# Patient Record
Sex: Female | Born: 1945 | Race: White | Hispanic: No | Marital: Married | Smoking: Never smoker
Health system: Southern US, Community
[De-identification: ages and names within clinical notes are randomized; demographics above are authoritative.]

## PROBLEM LIST (undated history)

## (undated) DIAGNOSIS — K635 Polyp of colon: Secondary | ICD-10-CM

## (undated) DIAGNOSIS — H409 Unspecified glaucoma: Secondary | ICD-10-CM

## (undated) DIAGNOSIS — C44321 Squamous cell carcinoma of skin of nose: Secondary | ICD-10-CM

## (undated) DIAGNOSIS — C50919 Malignant neoplasm of unspecified site of unspecified female breast: Secondary | ICD-10-CM

## (undated) DIAGNOSIS — C541 Malignant neoplasm of endometrium: Secondary | ICD-10-CM

## (undated) DIAGNOSIS — C44311 Basal cell carcinoma of skin of nose: Secondary | ICD-10-CM

## (undated) DIAGNOSIS — N842 Polyp of vagina: Secondary | ICD-10-CM

## (undated) DIAGNOSIS — M8580 Other specified disorders of bone density and structure, unspecified site: Secondary | ICD-10-CM

## (undated) HISTORY — PX: ABDOMINAL HYSTERECTOMY: SHX81

## (undated) HISTORY — DX: Unspecified glaucoma: H40.9

## (undated) HISTORY — DX: Other specified disorders of bone density and structure, unspecified site: M85.80

## (undated) HISTORY — DX: Malignant neoplasm of unspecified site of unspecified female breast: C50.919

## (undated) HISTORY — DX: Malignant neoplasm of endometrium: C54.1

## (undated) HISTORY — PX: PORT-A-CATH REMOVAL: SHX5289

## (undated) HISTORY — DX: Polyp of vagina: N84.2

## (undated) HISTORY — DX: Polyp of colon: K63.5

## (undated) HISTORY — DX: Squamous cell carcinoma of skin of nose: C44.321

## (undated) HISTORY — DX: Basal cell carcinoma of skin of nose: C44.311

## (undated) HISTORY — PX: TONSILLECTOMY: SUR1361

---

## 2007-01-23 DIAGNOSIS — C50919 Malignant neoplasm of unspecified site of unspecified female breast: Secondary | ICD-10-CM

## 2007-01-23 HISTORY — DX: Malignant neoplasm of unspecified site of unspecified female breast: C50.919

## 2007-03-12 HISTORY — PX: BREAST LUMPECTOMY W/ NEEDLE LOCALIZATION: SHX1266

## 2008-02-16 ENCOUNTER — Ambulatory Visit: Payer: Self-pay | Admitting: Oncology

## 2008-02-21 ENCOUNTER — Encounter: Admission: RE | Admit: 2008-02-21 | Discharge: 2008-02-21 | Payer: Self-pay | Admitting: General Surgery

## 2008-02-23 LAB — CBC WITH DIFFERENTIAL/PLATELET
Basophils Absolute: 0 10*3/uL (ref 0.0–0.1)
EOS%: 1.9 % (ref 0.0–7.0)
HCT: 38.1 % (ref 34.8–46.6)
HGB: 13.1 g/dL (ref 11.6–15.9)
LYMPH%: 31.8 % (ref 14.0–48.0)
MCH: 31 pg (ref 26.0–34.0)
MCV: 90.4 fL (ref 81.0–101.0)
MONO%: 8.3 % (ref 0.0–13.0)
NEUT%: 57 % (ref 39.6–76.8)
Platelets: 277 10*3/uL (ref 145–400)

## 2008-02-23 LAB — COMPREHENSIVE METABOLIC PANEL
AST: 18 U/L (ref 0–37)
Alkaline Phosphatase: 49 U/L (ref 39–117)
BUN: 12 mg/dL (ref 6–23)
Creatinine, Ser: 0.8 mg/dL (ref 0.40–1.20)
Glucose, Bld: 96 mg/dL (ref 70–99)

## 2008-02-23 LAB — CANCER ANTIGEN 27.29: CA 27.29: 20 U/mL (ref 0–39)

## 2008-02-24 LAB — VITAMIN D 25 HYDROXY (VIT D DEFICIENCY, FRACTURES): Vit D, 25-Hydroxy: 42 ng/mL (ref 30–89)

## 2008-02-28 ENCOUNTER — Ambulatory Visit: Admission: RE | Admit: 2008-02-28 | Discharge: 2008-02-28 | Payer: Self-pay | Admitting: Oncology

## 2008-02-28 ENCOUNTER — Encounter: Payer: Self-pay | Admitting: Oncology

## 2008-02-29 LAB — VITAMIN D 1,25 DIHYDROXY: Vit D, 1,25-Dihydroxy: 61 pg/mL (ref 15–75)

## 2008-03-06 ENCOUNTER — Ambulatory Visit (HOSPITAL_BASED_OUTPATIENT_CLINIC_OR_DEPARTMENT_OTHER): Admission: RE | Admit: 2008-03-06 | Discharge: 2008-03-06 | Payer: Self-pay | Admitting: General Surgery

## 2008-03-06 ENCOUNTER — Encounter (INDEPENDENT_AMBULATORY_CARE_PROVIDER_SITE_OTHER): Payer: Self-pay | Admitting: General Surgery

## 2008-03-14 ENCOUNTER — Ambulatory Visit: Admission: RE | Admit: 2008-03-14 | Discharge: 2008-06-12 | Payer: Self-pay | Admitting: Radiation Oncology

## 2008-03-24 ENCOUNTER — Encounter (INDEPENDENT_AMBULATORY_CARE_PROVIDER_SITE_OTHER): Payer: Self-pay | Admitting: General Surgery

## 2008-03-24 ENCOUNTER — Ambulatory Visit (HOSPITAL_COMMUNITY): Admission: RE | Admit: 2008-03-24 | Discharge: 2008-03-25 | Payer: Self-pay | Admitting: General Surgery

## 2008-03-24 HISTORY — PX: MASTECTOMY: SHX3

## 2008-04-06 ENCOUNTER — Ambulatory Visit: Payer: Self-pay | Admitting: Oncology

## 2008-04-25 LAB — CBC WITH DIFFERENTIAL/PLATELET
BASO%: 0.8 % (ref 0.0–2.0)
Basophils Absolute: 0 10*3/uL (ref 0.0–0.1)
EOS%: 5.3 % (ref 0.0–7.0)
Eosinophils Absolute: 0.2 10*3/uL (ref 0.0–0.5)
HCT: 38.1 % (ref 34.8–46.6)
HGB: 13.2 g/dL (ref 11.6–15.9)
LYMPH%: 32.6 % (ref 14.0–48.0)
MCH: 31.2 pg (ref 26.0–34.0)
MCHC: 34.5 g/dL (ref 32.0–36.0)
MCV: 90.5 fL (ref 81.0–101.0)
MONO#: 0.2 10*3/uL (ref 0.1–0.9)
MONO%: 6.5 % (ref 0.0–13.0)
NEUT#: 1.8 10*3/uL (ref 1.5–6.5)
NEUT%: 54.9 % (ref 39.6–76.8)
Platelets: 135 10*3/uL — ABNORMAL LOW (ref 145–400)
RBC: 4.22 10*6/uL (ref 3.70–5.32)
RDW: 11.8 % (ref 11.3–14.5)
WBC: 3.2 10*3/uL — ABNORMAL LOW (ref 3.9–10.0)
lymph#: 1 10*3/uL (ref 0.9–3.3)

## 2008-05-02 LAB — CBC WITH DIFFERENTIAL/PLATELET
Basophils Absolute: 0.1 10*3/uL (ref 0.0–0.1)
Eosinophils Absolute: 0.1 10*3/uL (ref 0.0–0.5)
HGB: 12.4 g/dL (ref 11.6–15.9)
MONO#: 0.6 10*3/uL (ref 0.1–0.9)
NEUT#: 8 10*3/uL — ABNORMAL HIGH (ref 1.5–6.5)
RDW: 12.8 % (ref 11.3–14.5)
lymph#: 1.7 10*3/uL (ref 0.9–3.3)

## 2008-05-09 LAB — COMPREHENSIVE METABOLIC PANEL
AST: 20 U/L (ref 0–37)
Albumin: 4.5 g/dL (ref 3.5–5.2)
BUN: 14 mg/dL (ref 6–23)
Calcium: 10 mg/dL (ref 8.4–10.5)
Chloride: 104 mEq/L (ref 96–112)
Glucose, Bld: 134 mg/dL — ABNORMAL HIGH (ref 70–99)
Potassium: 4.1 mEq/L (ref 3.5–5.3)

## 2008-05-09 LAB — CBC WITH DIFFERENTIAL/PLATELET
BASO%: 0.1 % (ref 0.0–2.0)
Basophils Absolute: 0 10*3/uL (ref 0.0–0.1)
EOS%: 0.1 % (ref 0.0–7.0)
HGB: 12 g/dL (ref 11.6–15.9)
MCH: 31.2 pg (ref 26.0–34.0)
NEUT#: 9.7 10*3/uL — ABNORMAL HIGH (ref 1.5–6.5)
RDW: 13.8 % (ref 11.3–14.5)
lymph#: 0.6 10*3/uL — ABNORMAL LOW (ref 0.9–3.3)

## 2008-05-19 ENCOUNTER — Ambulatory Visit: Payer: Self-pay | Admitting: Oncology

## 2008-05-23 LAB — CBC WITH DIFFERENTIAL/PLATELET
Basophils Absolute: 0.1 10*3/uL (ref 0.0–0.1)
Eosinophils Absolute: 0 10*3/uL (ref 0.0–0.5)
HGB: 11.6 g/dL (ref 11.6–15.9)
MCV: 90.6 fL (ref 81.0–101.0)
MONO#: 0.5 10*3/uL (ref 0.1–0.9)
NEUT#: 7.2 10*3/uL — ABNORMAL HIGH (ref 1.5–6.5)
RDW: 14.5 % (ref 11.3–14.5)
lymph#: 1.4 10*3/uL (ref 0.9–3.3)

## 2008-05-30 LAB — COMPREHENSIVE METABOLIC PANEL
Alkaline Phosphatase: 60 U/L (ref 39–117)
BUN: 14 mg/dL (ref 6–23)
CO2: 24 mEq/L (ref 19–32)
Glucose, Bld: 126 mg/dL — ABNORMAL HIGH (ref 70–99)
Sodium: 141 mEq/L (ref 135–145)
Total Bilirubin: 0.4 mg/dL (ref 0.3–1.2)
Total Protein: 7 g/dL (ref 6.0–8.3)

## 2008-05-30 LAB — CBC WITH DIFFERENTIAL/PLATELET
Basophils Absolute: 0 10*3/uL (ref 0.0–0.1)
Eosinophils Absolute: 0 10*3/uL (ref 0.0–0.5)
HCT: 34.1 % — ABNORMAL LOW (ref 34.8–46.6)
HGB: 11.6 g/dL (ref 11.6–15.9)
LYMPH%: 5.6 % — ABNORMAL LOW (ref 14.0–48.0)
MCV: 92.6 fL (ref 81.0–101.0)
MONO#: 0.1 10*3/uL (ref 0.1–0.9)
MONO%: 1.4 % (ref 0.0–13.0)
NEUT#: 10.2 10*3/uL — ABNORMAL HIGH (ref 1.5–6.5)
NEUT%: 92.9 % — ABNORMAL HIGH (ref 39.6–76.8)
Platelets: 200 10*3/uL (ref 145–400)
WBC: 11 10*3/uL — ABNORMAL HIGH (ref 3.9–10.0)

## 2008-06-06 LAB — CBC WITH DIFFERENTIAL/PLATELET
Basophils Absolute: 0.1 10*3/uL (ref 0.0–0.1)
Eosinophils Absolute: 0.1 10*3/uL (ref 0.0–0.5)
HGB: 11.4 g/dL — ABNORMAL LOW (ref 11.6–15.9)
LYMPH%: 24.2 % (ref 14.0–48.0)
MONO#: 1 10*3/uL — ABNORMAL HIGH (ref 0.1–0.9)
NEUT#: 3.6 10*3/uL (ref 1.5–6.5)
Platelets: 126 10*3/uL — ABNORMAL LOW (ref 145–400)
RBC: 3.6 10*6/uL — ABNORMAL LOW (ref 3.70–5.32)
RDW: 14.7 % — ABNORMAL HIGH (ref 11.3–14.5)
WBC: 6.3 10*3/uL (ref 3.9–10.0)

## 2008-06-13 LAB — CBC WITH DIFFERENTIAL/PLATELET
Eosinophils Absolute: 0 10*3/uL (ref 0.0–0.5)
HCT: 33.9 % — ABNORMAL LOW (ref 34.8–46.6)
LYMPH%: 18.5 % (ref 14.0–48.0)
MCV: 93.9 fL (ref 81.0–101.0)
MONO#: 0.4 10*3/uL (ref 0.1–0.9)
MONO%: 5.5 % (ref 0.0–13.0)
NEUT#: 5 10*3/uL (ref 1.5–6.5)
NEUT%: 74.5 % (ref 39.6–76.8)
Platelets: 186 10*3/uL (ref 145–400)
WBC: 6.7 10*3/uL (ref 3.9–10.0)

## 2008-06-20 LAB — CBC WITH DIFFERENTIAL/PLATELET
BASO%: 0.3 % (ref 0.0–2.0)
EOS%: 0.1 % (ref 0.0–7.0)
HCT: 31.6 % — ABNORMAL LOW (ref 34.8–46.6)
LYMPH%: 7.9 % — ABNORMAL LOW (ref 14.0–48.0)
MCH: 32.1 pg (ref 26.0–34.0)
MCHC: 34.1 g/dL (ref 32.0–36.0)
MCV: 94.3 fL (ref 81.0–101.0)
MONO%: 2.5 % (ref 0.0–13.0)
NEUT%: 89.3 % — ABNORMAL HIGH (ref 39.6–76.8)
Platelets: 202 10*3/uL (ref 145–400)

## 2008-06-20 LAB — COMPREHENSIVE METABOLIC PANEL
AST: 17 U/L (ref 0–37)
Albumin: 4.2 g/dL (ref 3.5–5.2)
CO2: 23 mEq/L (ref 19–32)
Calcium: 9.8 mg/dL (ref 8.4–10.5)
Creatinine, Ser: 0.62 mg/dL (ref 0.40–1.20)
Total Protein: 6.4 g/dL (ref 6.0–8.3)

## 2008-06-27 LAB — CBC WITH DIFFERENTIAL/PLATELET
BASO%: 1.1 % (ref 0.0–2.0)
EOS%: 0.7 % (ref 0.0–7.0)
HCT: 33.6 % — ABNORMAL LOW (ref 34.8–46.6)
MCH: 32.2 pg (ref 26.0–34.0)
MCHC: 34.4 g/dL (ref 32.0–36.0)
MCV: 93.6 fL (ref 81.0–101.0)
MONO%: 17.2 % — ABNORMAL HIGH (ref 0.0–13.0)
NEUT%: 58.4 % (ref 39.6–76.8)
RDW: 14.8 % — ABNORMAL HIGH (ref 11.3–14.5)
lymph#: 1.4 10*3/uL (ref 0.9–3.3)

## 2008-07-04 ENCOUNTER — Ambulatory Visit: Payer: Self-pay | Admitting: Oncology

## 2008-07-11 LAB — CBC WITH DIFFERENTIAL/PLATELET
BASO%: 0.2 % (ref 0.0–2.0)
Basophils Absolute: 0 10*3/uL (ref 0.0–0.1)
EOS%: 0 % (ref 0.0–7.0)
HGB: 11.2 g/dL — ABNORMAL LOW (ref 11.6–15.9)
MCH: 32.7 pg (ref 26.0–34.0)
MONO%: 2.5 % (ref 0.0–13.0)
RBC: 3.42 10*6/uL — ABNORMAL LOW (ref 3.70–5.32)
RDW: 16 % — ABNORMAL HIGH (ref 11.3–14.5)
lymph#: 0.5 10*3/uL — ABNORMAL LOW (ref 0.9–3.3)

## 2008-07-11 LAB — COMPREHENSIVE METABOLIC PANEL
BUN: 16 mg/dL (ref 6–23)
CO2: 25 mEq/L (ref 19–32)
Chloride: 106 mEq/L (ref 96–112)
Creatinine, Ser: 0.66 mg/dL (ref 0.40–1.20)
Glucose, Bld: 120 mg/dL — ABNORMAL HIGH (ref 70–99)
Potassium: 4 mEq/L (ref 3.5–5.3)
Sodium: 140 mEq/L (ref 135–145)
Total Bilirubin: 0.5 mg/dL (ref 0.3–1.2)

## 2008-07-18 LAB — CBC WITH DIFFERENTIAL/PLATELET
Basophils Absolute: 0.1 10*3/uL (ref 0.0–0.1)
Eosinophils Absolute: 0.1 10*3/uL (ref 0.0–0.5)
HCT: 31.5 % — ABNORMAL LOW (ref 34.8–46.6)
HGB: 10.9 g/dL — ABNORMAL LOW (ref 11.6–15.9)
LYMPH%: 24.6 % (ref 14.0–48.0)
MONO#: 1.1 10*3/uL — ABNORMAL HIGH (ref 0.1–0.9)
NEUT#: 3.3 10*3/uL (ref 1.5–6.5)
NEUT%: 54.3 % (ref 39.6–76.8)
Platelets: 139 10*3/uL — ABNORMAL LOW (ref 145–400)
RBC: 3.29 10*6/uL — ABNORMAL LOW (ref 3.70–5.32)
WBC: 6.2 10*3/uL (ref 3.9–10.0)

## 2008-07-19 ENCOUNTER — Ambulatory Visit: Admission: RE | Admit: 2008-07-19 | Discharge: 2008-07-19 | Payer: Self-pay | Admitting: Oncology

## 2008-07-19 ENCOUNTER — Encounter: Payer: Self-pay | Admitting: Oncology

## 2008-07-19 ENCOUNTER — Ambulatory Visit: Payer: Self-pay | Admitting: Cardiology

## 2008-07-25 LAB — CBC WITH DIFFERENTIAL/PLATELET
BASO%: 0.9 % (ref 0.0–2.0)
EOS%: 0.6 % (ref 0.0–7.0)
MCH: 33 pg (ref 26.0–34.0)
MCHC: 33.5 g/dL (ref 32.0–36.0)
MONO#: 0.4 10*3/uL (ref 0.1–0.9)
RBC: 3.32 10*6/uL — ABNORMAL LOW (ref 3.70–5.32)
RDW: 15.9 % — ABNORMAL HIGH (ref 11.3–14.5)
WBC: 6.6 10*3/uL (ref 3.9–10.0)
lymph#: 1 10*3/uL (ref 0.9–3.3)

## 2008-08-01 LAB — CBC WITH DIFFERENTIAL/PLATELET
Basophils Absolute: 0 10*3/uL (ref 0.0–0.1)
EOS%: 0.1 % (ref 0.0–7.0)
Eosinophils Absolute: 0 10*3/uL (ref 0.0–0.5)
HGB: 10.8 g/dL — ABNORMAL LOW (ref 11.6–15.9)
MCH: 33.4 pg (ref 26.0–34.0)
MONO#: 0.3 10*3/uL (ref 0.1–0.9)
NEUT#: 6.9 10*3/uL — ABNORMAL HIGH (ref 1.5–6.5)
RDW: 14.8 % — ABNORMAL HIGH (ref 11.3–14.5)
WBC: 7.7 10*3/uL (ref 3.9–10.0)
lymph#: 0.4 10*3/uL — ABNORMAL LOW (ref 0.9–3.3)

## 2008-08-01 LAB — COMPREHENSIVE METABOLIC PANEL
ALT: 40 U/L — ABNORMAL HIGH (ref 0–35)
AST: 19 U/L (ref 0–37)
Albumin: 4.4 g/dL (ref 3.5–5.2)
BUN: 15 mg/dL (ref 6–23)
CO2: 23 mEq/L (ref 19–32)
Calcium: 9.8 mg/dL (ref 8.4–10.5)
Chloride: 104 mEq/L (ref 96–112)
Potassium: 4 mEq/L (ref 3.5–5.3)

## 2008-08-08 LAB — CBC WITH DIFFERENTIAL/PLATELET
BASO%: 2.2 % — ABNORMAL HIGH (ref 0.0–2.0)
EOS%: 0.9 % (ref 0.0–7.0)
HGB: 10.6 g/dL — ABNORMAL LOW (ref 11.6–15.9)
MCH: 33.5 pg (ref 26.0–34.0)
MCHC: 34.6 g/dL (ref 32.0–36.0)
RDW: 13.4 % (ref 11.3–14.5)
lymph#: 1.5 10*3/uL (ref 0.9–3.3)

## 2008-08-10 ENCOUNTER — Encounter: Admission: RE | Admit: 2008-08-10 | Discharge: 2008-08-10 | Payer: Self-pay | Admitting: Oncology

## 2008-08-22 ENCOUNTER — Ambulatory Visit: Payer: Self-pay | Admitting: Oncology

## 2008-08-22 LAB — COMPREHENSIVE METABOLIC PANEL
ALT: 38 U/L — ABNORMAL HIGH (ref 0–35)
AST: 29 U/L (ref 0–37)
Albumin: 4.1 g/dL (ref 3.5–5.2)
Calcium: 9.4 mg/dL (ref 8.4–10.5)
Chloride: 106 mEq/L (ref 96–112)
Potassium: 4.2 mEq/L (ref 3.5–5.3)

## 2008-08-22 LAB — CBC WITH DIFFERENTIAL/PLATELET
Basophils Absolute: 0.1 10*3/uL (ref 0.0–0.1)
Eosinophils Absolute: 0.3 10*3/uL (ref 0.0–0.5)
HGB: 10.9 g/dL — ABNORMAL LOW (ref 11.6–15.9)
NEUT#: 1.4 10*3/uL — ABNORMAL LOW (ref 1.5–6.5)
RDW: 14 % (ref 11.3–14.5)
lymph#: 0.8 10*3/uL — ABNORMAL LOW (ref 0.9–3.3)

## 2008-09-12 LAB — CBC WITH DIFFERENTIAL/PLATELET
BASO%: 1.4 % (ref 0.0–2.0)
EOS%: 27.5 % — ABNORMAL HIGH (ref 0.0–7.0)
HCT: 35.4 % (ref 34.8–46.6)
LYMPH%: 29 % (ref 14.0–48.0)
MCH: 33.2 pg (ref 26.0–34.0)
MCHC: 34.1 g/dL (ref 32.0–36.0)
NEUT%: 32.8 % — ABNORMAL LOW (ref 39.6–76.8)
Platelets: 195 10*3/uL (ref 145–400)
RBC: 3.63 10*6/uL — ABNORMAL LOW (ref 3.70–5.32)

## 2008-10-03 LAB — CBC WITH DIFFERENTIAL/PLATELET
BASO%: 1.9 % (ref 0.0–2.0)
EOS%: 20.2 % — ABNORMAL HIGH (ref 0.0–7.0)
Eosinophils Absolute: 0.7 10*3/uL — ABNORMAL HIGH (ref 0.0–0.5)
LYMPH%: 34.1 % (ref 14.0–48.0)
MCH: 32.6 pg (ref 26.0–34.0)
MCHC: 33.5 g/dL (ref 32.0–36.0)
MCV: 97.3 fL (ref 81.0–101.0)
MONO%: 9.9 % (ref 0.0–13.0)
Platelets: 179 10*3/uL (ref 145–400)
RBC: 3.73 10*6/uL (ref 3.70–5.32)
RDW: 11.6 % (ref 11.3–14.5)

## 2008-10-03 LAB — CANCER ANTIGEN 27.29: CA 27.29: 14 U/mL (ref 0–39)

## 2008-10-04 LAB — COMPREHENSIVE METABOLIC PANEL

## 2008-10-20 ENCOUNTER — Ambulatory Visit: Payer: Self-pay | Admitting: Oncology

## 2008-10-24 LAB — COMPREHENSIVE METABOLIC PANEL
AST: 20 U/L (ref 0–37)
Alkaline Phosphatase: 56 U/L (ref 39–117)
BUN: 14 mg/dL (ref 6–23)
Calcium: 9.5 mg/dL (ref 8.4–10.5)
Creatinine, Ser: 0.71 mg/dL (ref 0.40–1.20)
Glucose, Bld: 81 mg/dL (ref 70–99)
Total Bilirubin: 0.4 mg/dL (ref 0.3–1.2)
Total Protein: 6.5 g/dL (ref 6.0–8.3)

## 2008-10-24 LAB — CBC WITH DIFFERENTIAL/PLATELET
Basophils Absolute: 0 10*3/uL (ref 0.0–0.1)
EOS%: 11.4 % — ABNORMAL HIGH (ref 0.0–7.0)
Eosinophils Absolute: 0.3 10*3/uL (ref 0.0–0.5)
HGB: 12.8 g/dL (ref 11.6–15.9)
LYMPH%: 32.6 % (ref 14.0–48.0)
MCH: 32.1 pg (ref 26.0–34.0)
MCV: 93.2 fL (ref 81.0–101.0)
MONO%: 10.5 % (ref 0.0–13.0)
NEUT#: 1.2 10*3/uL — ABNORMAL LOW (ref 1.5–6.5)
NEUT%: 44.3 % (ref 39.6–76.8)
Platelets: 172 10*3/uL (ref 145–400)

## 2008-10-31 ENCOUNTER — Ambulatory Visit: Admission: RE | Admit: 2008-10-31 | Discharge: 2008-10-31 | Payer: Self-pay | Admitting: Oncology

## 2008-10-31 ENCOUNTER — Encounter: Payer: Self-pay | Admitting: Oncology

## 2008-11-14 LAB — COMPREHENSIVE METABOLIC PANEL
ALT: 41 U/L — ABNORMAL HIGH (ref 0–35)
Alkaline Phosphatase: 53 U/L (ref 39–117)
Creatinine, Ser: 0.7 mg/dL (ref 0.40–1.20)
Glucose, Bld: 95 mg/dL (ref 70–99)
Sodium: 142 mEq/L (ref 135–145)
Total Bilirubin: 0.5 mg/dL (ref 0.3–1.2)
Total Protein: 6.5 g/dL (ref 6.0–8.3)

## 2008-11-14 LAB — CBC WITH DIFFERENTIAL/PLATELET
Basophils Absolute: 0.1 10*3/uL (ref 0.0–0.1)
EOS%: 13.2 % — ABNORMAL HIGH (ref 0.0–7.0)
Eosinophils Absolute: 0.4 10*3/uL (ref 0.0–0.5)
HGB: 12.6 g/dL (ref 11.6–15.9)
LYMPH%: 28.7 % (ref 14.0–48.0)
MCH: 31.6 pg (ref 26.0–34.0)
MCV: 91.1 fL (ref 81.0–101.0)
MONO%: 9.6 % (ref 0.0–13.0)
NEUT#: 1.6 10*3/uL (ref 1.5–6.5)
NEUT%: 46.7 % (ref 39.6–76.8)
Platelets: 200 10*3/uL (ref 145–400)

## 2008-11-14 LAB — CANCER ANTIGEN 27.29: CA 27.29: 15 U/mL (ref 0–39)

## 2008-12-05 ENCOUNTER — Ambulatory Visit: Payer: Self-pay | Admitting: Oncology

## 2008-12-05 LAB — COMPREHENSIVE METABOLIC PANEL
AST: 18 U/L (ref 0–37)
Albumin: 4.3 g/dL (ref 3.5–5.2)
Alkaline Phosphatase: 60 U/L (ref 39–117)
BUN: 14 mg/dL (ref 6–23)
Glucose, Bld: 72 mg/dL (ref 70–99)
Potassium: 4.3 mEq/L (ref 3.5–5.3)
Sodium: 142 mEq/L (ref 135–145)
Total Bilirubin: 0.5 mg/dL (ref 0.3–1.2)

## 2008-12-05 LAB — CBC WITH DIFFERENTIAL/PLATELET
Basophils Absolute: 0 10*3/uL (ref 0.0–0.1)
EOS%: 11.1 % — ABNORMAL HIGH (ref 0.0–7.0)
Eosinophils Absolute: 0.4 10*3/uL (ref 0.0–0.5)
HGB: 13.4 g/dL (ref 11.6–15.9)
MCV: 92.3 fL (ref 81.0–101.0)
MONO%: 7.8 % (ref 0.0–13.0)
NEUT#: 1.7 10*3/uL (ref 1.5–6.5)
RBC: 4.21 10*6/uL (ref 3.70–5.32)
RDW: 12.8 % (ref 11.3–14.5)
lymph#: 1.5 10*3/uL (ref 0.9–3.3)

## 2008-12-26 LAB — CBC WITH DIFFERENTIAL/PLATELET
BASO%: 0.8 % (ref 0.0–2.0)
EOS%: 5.1 % (ref 0.0–7.0)
HCT: 37 % (ref 34.8–46.6)
HGB: 12.9 g/dL (ref 11.6–15.9)
MCHC: 34.9 g/dL (ref 32.0–36.0)
MONO#: 0.4 10*3/uL (ref 0.1–0.9)
NEUT%: 47 % (ref 39.6–76.8)
RDW: 13.9 % (ref 11.3–14.5)
WBC: 3.7 10*3/uL — ABNORMAL LOW (ref 3.9–10.0)
lymph#: 1.4 10*3/uL (ref 0.9–3.3)

## 2008-12-26 LAB — COMPREHENSIVE METABOLIC PANEL
ALT: 17 U/L (ref 0–35)
Albumin: 4.3 g/dL (ref 3.5–5.2)
BUN: 15 mg/dL (ref 6–23)
CO2: 24 mEq/L (ref 19–32)
Calcium: 9.5 mg/dL (ref 8.4–10.5)
Chloride: 104 mEq/L (ref 96–112)
Creatinine, Ser: 0.77 mg/dL (ref 0.40–1.20)
Potassium: 3.8 mEq/L (ref 3.5–5.3)

## 2009-01-02 ENCOUNTER — Ambulatory Visit: Payer: Self-pay | Admitting: Internal Medicine

## 2009-01-10 ENCOUNTER — Ambulatory Visit: Payer: Self-pay

## 2009-01-15 ENCOUNTER — Ambulatory Visit: Payer: Self-pay | Admitting: Internal Medicine

## 2009-01-16 LAB — CBC WITH DIFFERENTIAL/PLATELET
Basophils Absolute: 0 10*3/uL (ref 0.0–0.1)
Eosinophils Absolute: 0.2 10*3/uL (ref 0.0–0.5)
HCT: 37 % (ref 34.8–46.6)
HGB: 12.5 g/dL (ref 11.6–15.9)
MCH: 30.7 pg (ref 25.1–34.0)
MONO#: 0.3 10*3/uL (ref 0.1–0.9)
NEUT#: 1.4 10*3/uL — ABNORMAL LOW (ref 1.5–6.5)
NEUT%: 46.8 % (ref 38.4–76.8)
RDW: 13.5 % (ref 11.2–14.5)
WBC: 3 10*3/uL — ABNORMAL LOW (ref 3.9–10.3)
lymph#: 1.1 10*3/uL (ref 0.9–3.3)

## 2009-01-16 LAB — COMPREHENSIVE METABOLIC PANEL
Albumin: 4.4 g/dL (ref 3.5–5.2)
Alkaline Phosphatase: 65 U/L (ref 39–117)
BUN: 16 mg/dL (ref 6–23)
CO2: 27 mEq/L (ref 19–32)
Calcium: 9.6 mg/dL (ref 8.4–10.5)
Chloride: 105 mEq/L (ref 96–112)
Glucose, Bld: 71 mg/dL (ref 70–99)
Potassium: 4.1 mEq/L (ref 3.5–5.3)
Sodium: 141 mEq/L (ref 135–145)
Total Protein: 6.8 g/dL (ref 6.0–8.3)

## 2009-01-30 ENCOUNTER — Ambulatory Visit: Admission: RE | Admit: 2009-01-30 | Discharge: 2009-01-30 | Payer: Self-pay | Admitting: Oncology

## 2009-01-30 ENCOUNTER — Encounter: Payer: Self-pay | Admitting: Oncology

## 2009-02-02 ENCOUNTER — Ambulatory Visit: Payer: Self-pay | Admitting: Oncology

## 2009-02-06 LAB — CBC WITH DIFFERENTIAL/PLATELET
BASO%: 0.5 % (ref 0.0–2.0)
Eosinophils Absolute: 0.8 10*3/uL — ABNORMAL HIGH (ref 0.0–0.5)
HCT: 39.4 % (ref 34.8–46.6)
LYMPH%: 34.4 % (ref 14.0–49.7)
MONO#: 0.3 10*3/uL (ref 0.1–0.9)
NEUT#: 1.7 10*3/uL (ref 1.5–6.5)
Platelets: 158 10*3/uL (ref 145–400)
RBC: 4.36 10*6/uL (ref 3.70–5.45)
WBC: 4.2 10*3/uL (ref 3.9–10.3)
lymph#: 1.5 10*3/uL (ref 0.9–3.3)
nRBC: 0 % (ref 0–0)

## 2009-05-07 ENCOUNTER — Ambulatory Visit: Payer: Self-pay | Admitting: Oncology

## 2009-05-09 ENCOUNTER — Encounter: Payer: Self-pay | Admitting: Internal Medicine

## 2009-05-09 LAB — CBC WITH DIFFERENTIAL/PLATELET
BASO%: 0.4 % (ref 0.0–2.0)
EOS%: 16 % — ABNORMAL HIGH (ref 0.0–7.0)
HCT: 37.4 % (ref 34.8–46.6)
LYMPH%: 28.5 % (ref 14.0–49.7)
MCH: 31.6 pg (ref 25.1–34.0)
MCHC: 34.2 g/dL (ref 31.5–36.0)
MCV: 92.3 fL (ref 79.5–101.0)
MONO%: 6.7 % (ref 0.0–14.0)
NEUT%: 48.4 % (ref 38.4–76.8)
Platelets: 161 10*3/uL (ref 145–400)

## 2009-05-10 LAB — COMPREHENSIVE METABOLIC PANEL
ALT: 14 U/L (ref 0–35)
AST: 16 U/L (ref 0–37)
BUN: 16 mg/dL (ref 6–23)
Creatinine, Ser: 0.81 mg/dL (ref 0.40–1.20)
Total Bilirubin: 0.4 mg/dL (ref 0.3–1.2)

## 2009-05-10 LAB — VITAMIN D 25 HYDROXY (VIT D DEFICIENCY, FRACTURES): Vit D, 25-Hydroxy: 44 ng/mL (ref 30–89)

## 2009-08-07 ENCOUNTER — Ambulatory Visit: Payer: Self-pay | Admitting: Oncology

## 2009-08-09 ENCOUNTER — Encounter: Payer: Self-pay | Admitting: Internal Medicine

## 2009-08-09 LAB — CBC WITH DIFFERENTIAL/PLATELET
BASO%: 1 % (ref 0.0–2.0)
EOS%: 7.9 % — ABNORMAL HIGH (ref 0.0–7.0)
Eosinophils Absolute: 0.3 10*3/uL (ref 0.0–0.5)
MCH: 32.4 pg (ref 25.1–34.0)
MCHC: 34.5 g/dL (ref 31.5–36.0)
MCV: 93.9 fL (ref 79.5–101.0)
MONO%: 11.2 % (ref 0.0–14.0)
NEUT#: 1.4 10*3/uL — ABNORMAL LOW (ref 1.5–6.5)
RBC: 4.05 10*6/uL (ref 3.70–5.45)
RDW: 13.3 % (ref 11.2–14.5)

## 2009-08-10 LAB — COMPREHENSIVE METABOLIC PANEL
ALT: 16 U/L (ref 0–35)
AST: 19 U/L (ref 0–37)
Albumin: 4.5 g/dL (ref 3.5–5.2)
Alkaline Phosphatase: 58 U/L (ref 39–117)
Potassium: 4.3 mEq/L (ref 3.5–5.3)
Sodium: 141 mEq/L (ref 135–145)
Total Protein: 6.8 g/dL (ref 6.0–8.3)

## 2009-12-07 ENCOUNTER — Ambulatory Visit: Payer: Self-pay | Admitting: Oncology

## 2009-12-11 ENCOUNTER — Encounter: Payer: Self-pay | Admitting: Internal Medicine

## 2009-12-11 LAB — CBC WITH DIFFERENTIAL/PLATELET
BASO%: 0.7 % (ref 0.0–2.0)
Basophils Absolute: 0 10*3/uL (ref 0.0–0.1)
EOS%: 3.5 % (ref 0.0–7.0)
Eosinophils Absolute: 0.2 10*3/uL (ref 0.0–0.5)
HCT: 38.6 % (ref 34.8–46.6)
HGB: 13.1 g/dL (ref 11.6–15.9)
LYMPH%: 26.3 % (ref 14.0–49.7)
MCH: 32.1 pg (ref 25.1–34.0)
MCHC: 34 g/dL (ref 31.5–36.0)
MCV: 94.7 fL (ref 79.5–101.0)
MONO#: 0.4 10*3/uL (ref 0.1–0.9)
MONO%: 8.1 % (ref 0.0–14.0)
NEUT#: 3.3 10*3/uL (ref 1.5–6.5)
NEUT%: 61.4 % (ref 38.4–76.8)
Platelets: 223 10*3/uL (ref 145–400)
RBC: 4.08 10*6/uL (ref 3.70–5.45)
RDW: 13.6 % (ref 11.2–14.5)
WBC: 5.3 10*3/uL (ref 3.9–10.3)
lymph#: 1.4 10*3/uL (ref 0.9–3.3)

## 2009-12-11 LAB — COMPREHENSIVE METABOLIC PANEL WITH GFR
ALT: 14 U/L (ref 0–35)
AST: 17 U/L (ref 0–37)
Albumin: 4.6 g/dL (ref 3.5–5.2)
Alkaline Phosphatase: 58 U/L (ref 39–117)
BUN: 16 mg/dL (ref 6–23)
CO2: 26 meq/L (ref 19–32)
Calcium: 10.2 mg/dL (ref 8.4–10.5)
Chloride: 103 meq/L (ref 96–112)
Creatinine, Ser: 0.77 mg/dL (ref 0.40–1.20)
Glucose, Bld: 104 mg/dL — ABNORMAL HIGH (ref 70–99)
Potassium: 4.1 meq/L (ref 3.5–5.3)
Sodium: 140 meq/L (ref 135–145)
Total Bilirubin: 0.4 mg/dL (ref 0.3–1.2)
Total Protein: 6.8 g/dL (ref 6.0–8.3)

## 2009-12-11 LAB — VITAMIN D 25 HYDROXY (VIT D DEFICIENCY, FRACTURES): Vit D, 25-Hydroxy: 51 ng/mL (ref 30–89)

## 2009-12-11 LAB — CANCER ANTIGEN 27.29: CA 27.29: 13 U/mL (ref 0–39)

## 2009-12-11 LAB — LACTATE DEHYDROGENASE: LDH: 126 U/L (ref 94–250)

## 2010-04-08 ENCOUNTER — Ambulatory Visit: Payer: Self-pay | Admitting: Oncology

## 2010-04-10 ENCOUNTER — Encounter: Payer: Self-pay | Admitting: Internal Medicine

## 2010-04-10 LAB — CBC WITH DIFFERENTIAL/PLATELET
BASO%: 0.7 % (ref 0.0–2.0)
Basophils Absolute: 0 10*3/uL (ref 0.0–0.1)
HCT: 36.5 % (ref 34.8–46.6)
HGB: 12.5 g/dL (ref 11.6–15.9)
MONO#: 0.4 10*3/uL (ref 0.1–0.9)
NEUT%: 47.9 % (ref 38.4–76.8)
WBC: 4.5 10*3/uL (ref 3.9–10.3)
lymph#: 1.4 10*3/uL (ref 0.9–3.3)

## 2010-04-10 LAB — COMPREHENSIVE METABOLIC PANEL
ALT: 11 U/L (ref 0–35)
Albumin: 4.1 g/dL (ref 3.5–5.2)
BUN: 12 mg/dL (ref 6–23)
CO2: 27 mEq/L (ref 19–32)
Calcium: 9.5 mg/dL (ref 8.4–10.5)
Chloride: 104 mEq/L (ref 96–112)
Creatinine, Ser: 0.81 mg/dL (ref 0.40–1.20)

## 2010-04-10 LAB — LACTATE DEHYDROGENASE: LDH: 112 U/L (ref 94–250)

## 2010-08-12 ENCOUNTER — Ambulatory Visit: Payer: Self-pay | Admitting: Oncology

## 2010-08-14 ENCOUNTER — Encounter: Payer: Self-pay | Admitting: Internal Medicine

## 2010-08-14 LAB — COMPREHENSIVE METABOLIC PANEL
AST: 15 U/L (ref 0–37)
Alkaline Phosphatase: 59 U/L (ref 39–117)
BUN: 16 mg/dL (ref 6–23)
Creatinine, Ser: 0.84 mg/dL (ref 0.40–1.20)

## 2010-08-14 LAB — CBC WITH DIFFERENTIAL/PLATELET
Basophils Absolute: 0 10*3/uL (ref 0.0–0.1)
EOS%: 3.5 % (ref 0.0–7.0)
HGB: 12.7 g/dL (ref 11.6–15.9)
MCH: 31.6 pg (ref 25.1–34.0)
MCV: 92.9 fL (ref 79.5–101.0)
MONO%: 9.7 % (ref 0.0–14.0)
NEUT%: 50.8 % (ref 38.4–76.8)
RDW: 13.8 % (ref 11.2–14.5)

## 2010-08-14 LAB — VITAMIN D 25 HYDROXY (VIT D DEFICIENCY, FRACTURES): Vit D, 25-Hydroxy: 60 ng/mL (ref 30–89)

## 2010-09-03 HISTORY — PX: BASAL CELL CARCINOMA EXCISION: SHX1214

## 2010-12-15 ENCOUNTER — Encounter: Payer: Self-pay | Admitting: General Surgery

## 2010-12-25 NOTE — Letter (Signed)
[facility]   Imported By: Roderic Ovens 05/29/2010 10:34:31  _____________________________________________________________________  External Attachment:    Type:   Image     Comment:   External Document

## 2010-12-25 NOTE — Letter (Signed)
[facility]   Imported By: Roderic Ovens 12/26/2009 11:22:51  _____________________________________________________________________  External Attachment:    Type:   Image     Comment:   External Document

## 2010-12-25 NOTE — Letter (Signed)
[facility]   Imported By: Roderic Ovens 09/04/2010 11:31:32  _____________________________________________________________________  External Attachment:    Type:   Image     Comment:   External Document

## 2011-02-12 ENCOUNTER — Other Ambulatory Visit: Payer: Self-pay | Admitting: Physician Assistant

## 2011-02-12 ENCOUNTER — Encounter (HOSPITAL_BASED_OUTPATIENT_CLINIC_OR_DEPARTMENT_OTHER): Payer: Medicare Other | Admitting: Oncology

## 2011-02-12 ENCOUNTER — Other Ambulatory Visit: Payer: Self-pay | Admitting: Oncology

## 2011-02-12 DIAGNOSIS — M810 Age-related osteoporosis without current pathological fracture: Secondary | ICD-10-CM

## 2011-02-12 DIAGNOSIS — C50519 Malignant neoplasm of lower-outer quadrant of unspecified female breast: Secondary | ICD-10-CM

## 2011-02-12 DIAGNOSIS — Z170 Estrogen receptor positive status [ER+]: Secondary | ICD-10-CM

## 2011-02-12 DIAGNOSIS — Z79899 Other long term (current) drug therapy: Secondary | ICD-10-CM

## 2011-02-12 DIAGNOSIS — C50919 Malignant neoplasm of unspecified site of unspecified female breast: Secondary | ICD-10-CM

## 2011-02-12 LAB — CBC WITH DIFFERENTIAL/PLATELET
BASO%: 0.7 % (ref 0.0–2.0)
EOS%: 4.1 % (ref 0.0–7.0)
Eosinophils Absolute: 0.2 10*3/uL (ref 0.0–0.5)
MCHC: 33.7 g/dL (ref 31.5–36.0)
MCV: 93.2 fL (ref 79.5–101.0)
MONO%: 8.8 % (ref 0.0–14.0)
NEUT#: 1.9 10*3/uL (ref 1.5–6.5)
RBC: 4.06 10*6/uL (ref 3.70–5.45)
RDW: 13.9 % (ref 11.2–14.5)

## 2011-02-13 LAB — COMPREHENSIVE METABOLIC PANEL
ALT: 15 U/L (ref 0–35)
AST: 18 U/L (ref 0–37)
Albumin: 4.5 g/dL (ref 3.5–5.2)
Alkaline Phosphatase: 57 U/L (ref 39–117)
Potassium: 4.4 mEq/L (ref 3.5–5.3)
Sodium: 141 mEq/L (ref 135–145)
Total Bilirubin: 0.5 mg/dL (ref 0.3–1.2)
Total Protein: 6.9 g/dL (ref 6.0–8.3)

## 2011-04-08 NOTE — Op Note (Signed)
Gloria Jackson, Gloria Jackson                ACCOUNT NO.:  [id number]   MEDICAL RECORD NO.:  [id number]          Jackson TYPE:  AMB   LOCATION:  DAY                          FACILITY:  [facility]   PHYSICIAN:  Lennie Muckle, MD      DATE OF BIRTH:  [birth year]   DATE OF PROCEDURE:  03/24/2008  DATE OF DISCHARGE:                               OPERATIVE REPORT   PREOPERATIVE DIAGNOSIS:  Left breast cancer with positive anterior  margin and close inferior margin.   POSTOPERATIVE DIAGNOSIS:  Left breast cancer with positive anterior  margin and close inferior margin.   PROCEDURE:  Left simple mastectomy.   SURGEON:  Bertram Savin   ASSISTANT:  Wenda Low   General endotracheal anesthesia.   FINDINGS:  Gloria cavity from Gloria previous lumpectomy on Gloria inferior  portion was close to Gloria inferior border of Gloria skin.  No abnormalities  were palpated.  Estimated blood loss was minimal.  No immediate  complications.  A 19 Blake drain was placed in Gloria anterior chest wall  space.   INDICATIONS FOR PROCEDURE:  Gloria Jackson is a 65 year old female who had  had a lumpectomy with sentinel node biopsy performed on Gloria 13th of  April.  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  There were 2 separate re-excisions which were negative.  However, there was concern among oncology that Gloria Jackson might have  residual disease of calcifications.  I had had a discussion with [name] preoperatively about doing Gloria mastectomy versus re-excision.  She  did not want to undergo a 3rd procedure and so elected to perform  mastectomy.  Informed consent was obtained prior to Gloria procedure.  Due  to Gloria procedure, she was identified in Gloria preoperative holding suite  and given IV cephazolin.  Once in Gloria operating room, she was placed in  Gloria supine position and placed under general endotracheal anesthesia.  Her left breast  was prepped and draped in Gloria usual sterile fashion.  A  time-out indicating Gloria Jackson and procedure were performed.  Using Gloria  nipple as Gloria landmark as well as Gloria previous lumpectomy scar, I marked  borders using a silk suture and placed an elliptical incision around Gloria  nipple-areolar complex.  Using 1% lidocaine with epinephrine, I injected  approximately 50 mL into Gloria tissues to create a skin flap.  Gloria  incision was placed with a #10 blade, and I elevated Gloria skin flaps  using Gloria towel clamps as retractors.  I used Gloria electrocautery device  to create my skin flaps and perform Gloria mastectomy.  Gloria medial border  went to Gloria sternum, superiorly to Gloria clavicle, inferiorly down to Gloria  serratus anterior and laterally, I did come to my previous axillary node  dissection site.  Gloria inferior border edge encompassed Gloria previous  lumpectomy site.  There was serous fluid which was evacuated.  Gloria  margin was very close to Gloria skin site.  I was able to fully  mobilize  Gloria breast tissue and laterally carried Gloria dissection off Gloria  pectoralis muscle into Gloria axilla.  I mobilized Gloria tail of Gloria breast  and fully removed Gloria breast tissue.  Gloria lateral border was marked with  a double suture.  Gloria superior tail was marked with Gloria single suture.  I then elected to perform an incision of Gloria inferior border of Gloria skin  to encompass Gloria previous site which was positive.  Gloria skin flaps did  seem to close without difficulty.  I performed a double closure with 3-0  Vicryl suture on Gloria dermal layer and performed a running 4-0 Monocryl  for skin closure.  Steri-Strips were placed for final dressing.  I did  place a 19 Blake drain along Gloria chest wall.  This was sutured with a 2-  0 nylon suture.  After Steri-Strips were placed, I placed a large fluff  dressing and wrapped Gloria Jackson with an Ace wrap.  She was then  extubated, transported to postanesthesia care unit in stable condition.  If  pain is well-controlled, then I will be able to let her go home  tonight and follow up with me in a week's time to evaluate whether or  not I can remove her drain.  I will call her with her final pathology.  She is given Percocet for pain, will start ibuprofen tomorrow and will  keep a diary of Gloria out-take of her JP as well as begin gentle  stretching of her chest wall on Sunday.      Lennie Muckle, MD  Electronically Signed     ALA/MEDQ  D:  03/24/2008  T:  03/24/2008  Job:  [id number]   cc:   Maryln Gottron, M.D.  Fax: [phone]   [name], MD  Fax: [phone]

## 2011-04-08 NOTE — Letter (Signed)
January 02, 2009    Gloria Crane, MD  [address] [facility]  [place], [place] [address]   RE:  Gloria Jackson, Gloria Jackson  MRN:  [id number]  /  DOB:  12-04-1945   Dear Dr. Donnie Coffin:   It was my pleasure to see your patient, Gloria Jackson, in  electrophysiology consultation today regarding her palpitations.  As you  recall, she is a very pleasant 65 year old female with a history of  longstanding palpitations.  She reports that over the past 20 years, she  has had palpitations which she describes as extra beats of her heart.  These beats are often forceful.  She reports taking atenolol in the  distant past with near resolution of her PVCs.  Over the past few  months, she has had increasing frequency of her PVCs.  She notes that  they often occur at rest.  She is unaware of any triggers or  precipitance.  She notes that they are left prominent when she is  active.  She denies chest pain, shortness of breath, presyncope, or  syncope.  She reports brief lightheadedness as well as a forceful  heartbeat.  These episodes seem to wax and wane in frequency.  She is  otherwise without complaint today.   PAST MEDICAL HISTORY:  1. Breast cancer status post mastectomy Mar 24, 2008, with subsequent      chemotherapy and Herceptin therapy.  2. Mitral valve prolapse.  3. Status post hysterectomy.   ALLERGIES:  No known drug allergies.   CURRENT MEDICATIONS:  1. Coumadin.  2. Femara 2.5 mg daily.  3. Fish oil 1000 mg b.i.d.  4. Vitamin D plus calcium 500 mg b.i.d.  5. Vitamin D daily.  6. Centrum Silver daily.  7. Colace daily.  8. Herceptin chemotherapy.   SOCIAL HISTORY:  The patient lives in [place].  She is a retired  Engineer, civil (consulting) from [employer].  She denies tobacco or drug use and  rarely drinks alcohol.   FAMILY HISTORY:  Her father died suddenly at age 56.  She has a paternal  grandmother who died suddenly at age 48.  She is unaware of any other  significant family history.   REVIEW OF  SYSTEMS:  All systems were reviewed and negative except as  outlined in the HPI above.   PHYSICAL EXAMINATION:  VITAL SIGNS:  Blood pressure 146/80, heart rate  80, respirations 18, weight 157 pounds.  GENERAL:  The patient is a well-appearing female in no acute distress.  She is alert and oriented x3.  HEENT:  Normocephalic, atraumatic.  Sclerae clear.  Conjunctivae pink.  Oropharynx clear.  NECK:  Supple.  No JVD, thyromegaly, or bruits.  LUNGS:  Clear to auscultation bilaterally.  HEART:  Regular rate and rhythm.  No murmurs, rubs, or gallops.  GI:  Soft, nontender, nondistended.  Positive bowel sounds.  EXTREMITIES:  No clubbing, cyanosis, or edema.  NEUROLOGIC:  Strength and sensation are intact.  SKIN:  No ecchymosis or lacerations.  MUSCULOSKELETAL:  No deformity or atrophy.  PSYCH:  Euthymic mood, full affect.   EKG from December 26, 2008, reveals sinus rhythm at 76 beats per minute  and is otherwise normal.   Transthoracic echocardiogram performed October 31, 2008, reveals a left  ventricular end-diastolic dimension of 40.  IVS 9, posterior wall 13,  left ventricular ejection fraction 60-70% with a normal left ventricular  function and size.  There are no wall motion abnormalities.  The right  ventricle is normal  in size and function.   IMPRESSION:  Ms. Ploch is a pleasant 65 year old female with a  longstanding history of palpitations which previously were well control  with beta-blockers, who now presents with worsening palpitations.  Her  symptoms are classic for premature ventricular contractions.  I suspect  that she has benign premature ventricular contractions.  We will place a  48-hour Holter monitor to hopefully capture these premature ventricular  contractions.  I have asked her to return to clinic in 2 weeks for  further discussion.  If she is relatively asymptomatic then I would  recommend no medical therapy.  However, if she is symptomatic with her  premature  ventricular contractions then we could consider restarting a  beta-blocker which was previously well tolerated.   Thank you for the opportunity of participating in the care of Ms.  Sparks.  Please feel free to contact me if I can be of further  assistance.    Sincerely,      Hillis Range, MD  Electronically Signed    JA/MedQ  DD: 01/02/2009  DT: 01/03/2009  Job #: [id number]   CC:    Derrek Gu, MD

## 2011-04-08 NOTE — Discharge Summary (Signed)
Gloria Jackson, Gloria Jackson                ACCOUNT NO.:  [id number]   MEDICAL RECORD NO.:  [id number]          PATIENT TYPE:  OIB   LOCATION:  1413                         FACILITY[facility]   PHYSICIAN:  Lennie Muckle, MD      DATE OF BIRTH:  May 01, 1946   DATE OF ADMISSION:  03/24/2008  DATE OF DISCHARGE:  03/25/2008                               DISCHARGE SUMMARY   FINAL DIAGNOSIS:  Left breast cancer, status post left mastectomy.   HOSPITAL COURSE:  [name] was admitted following her mastectomy for  observation and pain management.  She has had good response with the  Percocet overnight with no significant complaints of pain.  J-P has put  out 35 ml in the past 24 hours, which is blood-tinged fluid.  Her chest  wall only has a minimal amount of bruising.  She will be discharged with  the instructions to continue her Percocet and Motrin upon discharge.  She can wean down the Percocet and only take Tylenol if she desires.  Begin chest-arm exercises tomorrow.  She can shower tomorrow but no  baths for two weeks.  She will follow up with me next week for possible  drain removal.      Lennie Muckle, MD  Electronically Signed     ALA/MEDQ  D:  03/25/2008  T:  03/25/2008  Job:  [id number]   cc:   [name], MD  Fax: [phone]   Dr. Kandice Hams

## 2011-04-08 NOTE — Op Note (Signed)
Gloria Jackson, Gloria Jackson                ACCOUNT NO.:  [id number]   MEDICAL RECORD NO.:  [id number]          PATIENT TYPE:  OUT   LOCATION:  XRAY                         FACILITY:  [facility]   PHYSICIAN:  Lennie Muckle, MD      DATE OF BIRTH:  1945-12-21   DATE OF PROCEDURE:  03/06/2008  DATE OF DISCHARGE:                               OPERATIVE REPORT   PREOPERATIVE DIAGNOSIS:  Left breast cancer.   POSTOPERATIVE DIAGNOSIS:  Left breast cancer.   PROCEDURE:  Left breast lumpectomy, sentinel lymph node biopsy, and a  right subclavian Port-A-Cath placement.   SURGEON:  Bertram Savin, MD   ANESTHESIA:  General endotracheal anesthesia.   SPECIMEN:  Left breast tissue with wire.  A re-excision of medial deep  tissue of the left breast and superior margin.  Sentinel lymph nodes x8.   ESTIMATED BLOOD LOSS:  Minimal.   COMPLICATIONS:  No immediate complications.   A PowerPort titanium implantable cord was placed in the left subclavian.  The patient was under general endotracheal anesthesia.   INDICATIONS FOR PROCEDURE:  Gloria Jackson is a 65 year old female who had  left breast cancer by biopsy of calcifications.  The total area expand  1.3 cm.  There was an area of calcifications measuring 3.2 x 4 x 4.4.  She had a biopsy of her left axilla, which was negative, and informed  consent was obtained for a sentinel node biopsy to ensure negative lymph  nodes as well as a Port-A-Cath placement.   DETAILS OF PROCEDURE:  Gloria Jackson was identified in the preoperative  holding suite where she had a wire placed by Dr. Cher[facility]ella [facility].  She was injected with the radiotracer  and her left breast was marked by me.  She was then taken to the  operating room, placed in supine position.  Her left breast and axilla  were prepped and draped in the usual sterile fashion.  A time-out  procedure indicating patient and procedure was performed.  The area was  identified in the left axilla  using the Neoprobe.   An incision was placed with a #15 blade after anesthetized in 0.25%  Marcaine.  Subcutaneous tissues was divided with electrocautery.  Using  blunt dissection, I was able to identify a lymph node, which was blue in  color and measured 861 on the counter.  I continued with the dissection  and found lymph node #2 which was measured #28 which was blue, but not  hot, lymph node #3 was hot and blue measured 169, lymph node #4 was  measured 35 and was blue at level 2 dissection, lymph node #5 measured  219, #6 measured 76, which was blue, and #7 measured 159 which was hot  and blue and #8 measured 104 which was hot, but not blue.  Baseline was  less than 80 on count.  I then closed after irrigation.  No evidence of  bleeding.  A 3-0 Vicryl and 4-0 Monocryl were used to close this area.  I then turned my attention to the left breast.   Using  the mammograms and the wire as a guide, I placed  the incision  using the wire towards the nipple.  Subcutaneous tissue was divided with  electrocautery.  I was able to perform the lumpectomy with wire in  position.  The deep area of resection took me to the chest wall; the  specimen did not seem to be irregular in this area.  The specimen was  sent for analysis by Dr. Yolanda Bonine.  The deep margin was noted to be  close to the area of resection.  The deep margin was on the chest wall;  however, there was a rim of tissue medially which I felt should be  resected given the possiblity of a close margin here. This area of  resection was marked with sutures for orientation.  I then also removed  a small rim of superior tissue as well as this was somewhat firmer than  the surrounding tissues.  The wound bed was irrigated.  There was no  evidence of bleeding.  The subcutaneous tissues were closed with 3-0  Vicryl suture.  I attempted to reapproximate as much as spaces I could  without disforming the breast.  Using a 4-0 Monocryl skin was  closed.  The area was then injected with 1% lidocaine approximately 30 mL were  used into the left breast cavity.  Dermabond was placed for final  dressing of the left breast and the axillary node dissection.   The patient was then repositioned and prepped for the Port-A-Cath  placement.  The left neck and subclavian area were included.  Using the  seeker needle, I placed the guide wire into the subclavian vein.  Using  a Seldinger technique, the implantable PowerPort was placed easily after  dilating the tissues.  The port was able to be easily aspirated and  flushed.  While waiting for radiology, I began the dissection for the  port placement on the right chest wall.  I placed an incision beneath  the puncture site.  Subcutaneous tissues were divided with the  electrocautery creating a small pocket for the port to be placed.  I  placed the port into the pocket to measure my dissection.  Once  satisfied with this, I removed the port of the operating field.  I then  used fluroscopy in order to verify placement of the catheter in the  subclavian vein.  The catheter was withdrawn from the atrium until the  distal portion was just above the atrium in the right subclavian vein.  The catheter was tunneled through the subcutaneous tissues and connected  to the port.   A final x-ray revealed the tip of the port at the end of the subclavian  vein and tip of the atrium.  A final flush was used of heparin.  Subcutaneous tissues were closed over the port with a 3-0 Vicryl after  securing the port with 2-0 Prolene.  Skin was closed with 4-0 Monocryl.  Final dressing included Dermabond.  The patient was then extubated and  transported to Postanesthesia Care Unit in stable condition.  A portable  chest x-ray was performed in recovery.  If these results are without  evidence of pneumothorax and good placement of the line, she will be  discharged to home.   She will follow up with Dr. Donnie Coffin this  coming Friday to discuss  chemotherapy options.  I will call her with her pathology results and I  will see her in approximately 2 or 3 weeks' time.  She  was instructed to  perform activities as tolerated.  However, I do not want her to perform  any heavy lifting greater than 20 pounds.  I am going to start  stretching her left arm and axilla in over the next couple of days.      Lennie Muckle, MD  Electronically Signed     ALA/MEDQ  D:  03/06/2008  T:  03/07/2008  Job:  [id number]   cc:   [name], MD  Enzo Bi, Dr.  Jeralyn Ruths, MD

## 2011-04-08 NOTE — Letter (Signed)
January 15, 2009    [name], MD  [address] - [facility]  [place], [place] [address]   RE:  Gloria Jackson, Gloria Jackson  MRN:  [id number]  /  DOB:  1946/10/21   Dear Dr. Donnie Coffin:   It was my pleasure to see Gloria Jackson in electrophysiology followup  today.  As you recall, she is a pleasant 65 year old female with  longstanding palpitations, which had recently increased in frequency and  duration.  These palpitations by history were consistent with premature  ventricular contractions.  A Holter monitor was therefore placed from  January 10, 2009, through January 12, 2009, which documented 546  premature ventricular contractions over 48 hours.  The patient was also  observed to have rare premature atrial contractions as well as 2 short  runs of nonsustained atrial tachycardia (the longest was 12 beats).  No  other arrhythmias were documented.  Her average heart rate was 80 beats  per minute with a range of 82-157 beats per minute.  The patient reports  doing reasonably well since that time.  She continues to have rare  palpitations consistent with PVCs.  She denies heart racing, chest  pain, shortness of breath, presyncope, syncope, or other concerns.  She  again continues to feel that her PVC burden does not affect her quality  of life, and therefore would not like to take a medicine long term.  She  is otherwise without complaint today.   PROBLEM LIST:  1. Premature ventricular contractions.  2. Breast cancer status post mastectomy Mar 24, 2008, with subsequent      chemotherapy and Herceptin therapy.  3. Mitral valve prolapse.  4. Status post hysterectomy.   CURRENT MEDICATIONS:  1. Coumadin.  2. Femara 2.5 mg daily.  3. Fish oil 1000 mg b.i.d.  4. Calcium plus vitamin D 500 mg b.i.d.  5. Multivitamin b.i.d.  6. Colace p.r.n.  7. Herceptin chemotherapy.   PHYSICAL EXAMINATION:  VITAL SIGNS:  Blood pressure 127/82, heart rate  76, respirations 18, weight 158 pounds.  GENERAL:  The  patient is a well-appearing female in no acute distress.  She is alert and oriented x3.  HEENT:  Normocephalic, atraumatic.  Sclerae are clear.  Conjunctivae are  pink.  Oropharynx is clear.  NECK:  Supple.  No thyromegaly, JVD, or bruits.  LUNGS:  Clear to auscultation bilaterally.  HEART:  Regular rate and rhythm.  No murmurs, rubs, or gallops.  GI:  Soft, nontender, nondistended.  Positive bowel sounds.  EXTREMITIES:  No clubbing, cyanosis, or edema.  NEUROLOGIC:  Strength and sensation are intact.  SKIN:  No ecchymosis or laceration.  MUSCULOSKELETAL:  No deformity or atrophy.  PSYCH:  Euthymic mood.  Full affect.   IMPRESSION:  Gloria Jackson is a very pleasant female with symptomatic  premature ventricular contractions who presents today for followup.  A  recent 48-hour monitor documented only 546 PVCs.  It would suggest that  her PVC burden is not very high.  The patient feels that her quality of  life is preserved and does not wish to be started on a beta-blocker or  calcium-channel blocker and this time.  Her blood pressure has improved  from her last visit to our clinic.   PLAN:  Therapeutic strategies for symptomatic PVCs were discussed in  detail with the patient today including both medicine and catheter-based  therapies.  The patient's PVC burden is not sufficient to allow for  catheter ablation.  I agree with her that if  her quality of life remains  preserved that we should probably avoid beta-blockers and calcium-  channel blockers, which carry their own side effects.  Should she have  increasing symptomatic PVCs then we should  consider nadolol 20 mg daily or verapamil.  I have asked the patient to  contact me if her PVC burden increases.   Dr. Donnie Coffin, thank you for the opportunity of participating in the care of  Gloria Jackson.  Please feel free to contact me if you wish to discuss her  care further.    Sincerely,      Hillis Range, MD  Electronically Signed     JA/MedQ  DD: 01/15/2009  DT: 01/16/2009  Job #: [id number]

## 2011-08-12 ENCOUNTER — Ambulatory Visit
Admission: RE | Admit: 2011-08-12 | Discharge: 2011-08-12 | Disposition: A | Discharge status: Home / Self Care | Payer: Medicare Other | Source: Ambulatory Visit | Attending: Oncology | Admitting: Oncology

## 2011-08-12 DIAGNOSIS — Z79899 Other long term (current) drug therapy: Secondary | ICD-10-CM

## 2011-08-12 DIAGNOSIS — C50919 Malignant neoplasm of unspecified site of unspecified female breast: Secondary | ICD-10-CM

## 2011-08-19 LAB — COMPREHENSIVE METABOLIC PANEL
Alkaline Phosphatase: 54
BUN: 9
CO2: 31
Chloride: 104
GFR calc non Af Amer: >60
Glucose, Bld: 102 — ABNORMAL HIGH
Potassium: 4
Total Bilirubin: 0.7

## 2011-08-19 LAB — DIFFERENTIAL
Basophils Absolute: 0
Basophils Relative: 1
Monocytes Absolute: 0.3
Neutro Abs: 2.4
Neutrophils Relative %: 61

## 2011-08-19 LAB — CBC
HCT: 38
Hemoglobin: 13
WBC: 3.9 — ABNORMAL LOW

## 2011-09-08 ENCOUNTER — Encounter (HOSPITAL_BASED_OUTPATIENT_CLINIC_OR_DEPARTMENT_OTHER): Payer: Medicare Other | Admitting: Oncology

## 2011-09-08 ENCOUNTER — Other Ambulatory Visit: Payer: Self-pay | Admitting: Physician Assistant

## 2011-09-08 DIAGNOSIS — C50519 Malignant neoplasm of lower-outer quadrant of unspecified female breast: Secondary | ICD-10-CM

## 2011-09-08 DIAGNOSIS — Z170 Estrogen receptor positive status [ER+]: Secondary | ICD-10-CM

## 2011-09-08 DIAGNOSIS — M810 Age-related osteoporosis without current pathological fracture: Secondary | ICD-10-CM

## 2011-09-08 LAB — CBC WITH DIFFERENTIAL/PLATELET
BASO%: 0.3 % (ref 0.0–2.0)
Basophils Absolute: 0 10*3/uL (ref 0.0–0.1)
EOS%: 3.5 % (ref 0.0–7.0)
HGB: 12.8 g/dL (ref 11.6–15.9)
MCH: 31.8 pg (ref 25.1–34.0)
NEUT#: 2 10*3/uL (ref 1.5–6.5)
RDW: 13.8 % (ref 11.2–14.5)
lymph#: 1.2 10*3/uL (ref 0.9–3.3)

## 2011-09-08 LAB — COMPREHENSIVE METABOLIC PANEL
ALT: 13 U/L (ref 0–35)
AST: 20 U/L (ref 0–37)
Albumin: 4.4 g/dL (ref 3.5–5.2)
BUN: 10 mg/dL (ref 6–23)
Calcium: 10.1 mg/dL (ref 8.4–10.5)
Chloride: 105 mEq/L (ref 96–112)
Potassium: 4.5 mEq/L (ref 3.5–5.3)
Total Protein: 6.7 g/dL (ref 6.0–8.3)

## 2011-10-03 ENCOUNTER — Ambulatory Visit: Payer: Medicare Other | Admitting: Oncology

## 2011-10-03 ENCOUNTER — Other Ambulatory Visit: Payer: Medicare Other | Admitting: Lab

## 2011-10-22 NOTE — Progress Notes (Signed)
CC:   Gloria Range, MD Gloria Jackson, M.D.  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, completed in September 2009, herceptin until March 2010, on Femara 2.5 mg daily.  Gloria Jackson returns for follow up, overall doing well.  She continues on Femara which she tolerates fairly well.  she does have occasional hand discomfort.  She denies any significant hot flashes.  ECOG status 0.  MEDICATION LIST:  Reviewed.  Continues on Femara, multivitamin, calcium, vitamin D3, vitamin D as well.  PHYSICAL EXAMINATION:  Pleasant alert woman looking stated age.  Vital signs: Today blood pressure is 136/76, temperature 99, pulse of 82, respiratory rate 20, weight is 156.9.  No palpable adenopathy in the head and neck area.  Trachea midline.  No thyromegaly.  Extraocular movements normal.  Lungs are clear.  Heart sounds are normal.  Left breast is surgically absent with a well healed mastectomy scar.  Right breast is free of any masses, skin changes, nipple inversion or discharge.  The axilla bilaterally are negative.  The abdomen is soft. No hepatosplenomegaly.  No inguinal adenopathy.  No peripheral edema.  LABORATORY DATA:  From 09/08/2011 are within normal limits.  Tumor marker normal.  Mammogram performed 02/2011 was normal.  Bone density test performed 07/2011 showed mild osteopenia of both the hip and spine.  There is minimal change over the past 2 years in terms of her bone density.  IMPRESSION AND PLAN:  ER positive, HER-2 positive breast cancer, status post lumpectomy, appropriate Herceptin based therapy, and mastectomy, currently on Femara.  I discussed vitamin D supplementation and continuous monitoring of her bone density and I will see her in 6 months time.    ______________________________ [name], M.D., F.R.C.P.C. PR/MEDQ  D:  10/22/2011  T:  10/22/2011  Job:   [id number]

## 2011-12-29 ENCOUNTER — Other Ambulatory Visit: Payer: Self-pay | Admitting: Oncology

## 2011-12-29 ENCOUNTER — Telehealth: Payer: Self-pay | Admitting: Oncology

## 2011-12-29 DIAGNOSIS — E559 Vitamin D deficiency, unspecified: Secondary | ICD-10-CM

## 2011-12-29 DIAGNOSIS — C50919 Malignant neoplasm of unspecified site of unspecified female breast: Secondary | ICD-10-CM

## 2011-12-29 NOTE — Telephone Encounter (Signed)
Returned pt's call re 2013 appts. gv pt appt for 4/30 (d/t per pt) and sent message to PR requesting lab orders. Per pt she always has lab when she comes for f/u (no order). Pt aware message will be sent to PR and she will be contacted re lab appt. Emailed PR re above today.

## 2011-12-29 NOTE — Telephone Encounter (Signed)
Added lb to 4/30 f/u appt. Orders entered by PR. S/w pt today re add on lab and new time for 4/30 @ 9 am.

## 2012-03-23 ENCOUNTER — Telehealth: Payer: Self-pay | Admitting: Oncology

## 2012-03-23 ENCOUNTER — Ambulatory Visit (HOSPITAL_BASED_OUTPATIENT_CLINIC_OR_DEPARTMENT_OTHER): Payer: Medicare Other | Admitting: Oncology

## 2012-03-23 ENCOUNTER — Telehealth: Payer: Self-pay | Admitting: *Deleted

## 2012-03-23 ENCOUNTER — Other Ambulatory Visit (HOSPITAL_BASED_OUTPATIENT_CLINIC_OR_DEPARTMENT_OTHER): Payer: Medicare Other | Admitting: Lab

## 2012-03-23 VITALS — BP 128/80 | HR 83 | Temp 97.9°F | Ht 67.5 in | Wt 159.7 lb

## 2012-03-23 DIAGNOSIS — E559 Vitamin D deficiency, unspecified: Secondary | ICD-10-CM

## 2012-03-23 DIAGNOSIS — M899 Disorder of bone, unspecified: Secondary | ICD-10-CM

## 2012-03-23 DIAGNOSIS — C50919 Malignant neoplasm of unspecified site of unspecified female breast: Secondary | ICD-10-CM

## 2012-03-23 DIAGNOSIS — Z170 Estrogen receptor positive status [ER+]: Secondary | ICD-10-CM

## 2012-03-23 DIAGNOSIS — C50519 Malignant neoplasm of lower-outer quadrant of unspecified female breast: Secondary | ICD-10-CM

## 2012-03-23 LAB — CBC WITH DIFFERENTIAL/PLATELET
BASO%: 1.3 % (ref 0.0–2.0)
LYMPH%: 34.3 % (ref 14.0–49.7)
MCH: 31.4 pg (ref 25.1–34.0)
MCHC: 34.2 g/dL (ref 31.5–36.0)
MCV: 92 fL (ref 79.5–101.0)
MONO%: 9.1 % (ref 0.0–14.0)
Platelets: 214 10*3/uL (ref 145–400)
RBC: 4.16 10*6/uL (ref 3.70–5.45)
nRBC: 0 % (ref 0–0)

## 2012-03-23 NOTE — Telephone Encounter (Signed)
gve the pt her nov 2013 appt calendar °

## 2012-03-23 NOTE — Telephone Encounter (Signed)
sent michelle email on setting patient up for treatment on 04-01-2012

## 2012-03-24 LAB — COMPREHENSIVE METABOLIC PANEL
ALT: 13 U/L (ref 0–35)
AST: 18 U/L (ref 0–37)
Creatinine, Ser: 0.84 mg/dL (ref 0.50–1.10)
Total Bilirubin: 0.4 mg/dL (ref 0.3–1.2)

## 2012-03-24 LAB — VITAMIN D 25 HYDROXY (VIT D DEFICIENCY, FRACTURES): Vit D, 25-Hydroxy: 73 ng/mL (ref 30–89)

## 2012-03-24 NOTE — Progress Notes (Signed)
Hematology and Oncology Follow Up Visit  Gloria Jackson [id number] 11-11-1946 66 y.o. 03/24/2012 1:54 AM   DIAGNOSIS: 66 year old woman with history of breast cancer status post treatment with both surgery chemotherapy and radiation  Encounter Diagnoses  Name Primary?  . Malignant neoplasm of breast (female), unspecified site Yes  . Unspecified vitamin D deficiency      PAST THERAPY: TCH x6 completed September 2009, ration therapy completed March 2010, on adjuvant Femara therapy   Interim History:  Patient is doing well she has no specific complaints follow tomorrow relatively well has occasional joint pains. Denies any significant hot flashes.   Medications: I have reviewed the patient's current medications.  Allergies: No Known Allergies  Past Medical History, Surgical history, Social history, and Family History were reviewed and updated.  Review of Systems: Constitutional:  Negative for fever, chills, night sweats, anorexia, weight loss, pain. Cardiovascular: no chest pain or dyspnea on exertion Respiratory: negative Neurological: negative Dermatological: negative ENT: negative Skin Gastrointestinal: negative Genito-Urinary: negative Hematological and Lymphatic: negative Breast: negative Musculoskeletal: negative Remaining ROS negative.  Physical Exam:  Blood pressure 128/80, pulse 83, temperature 97.9 F (36.6 C), height 5' 7.5" (1.715 m), weight 159 lb 11.2 oz (72.439 kg).  ECOG:    General appearance: alert, cooperative and appears stated age Eyes: conjunctivae/corneas clear. PERRL, EOM's intact. Fundi benign. Throat: lips, mucosa, and tongue normal; teeth and gums normal Resp: clear to auscultation bilaterally and normal percussion bilaterally Breasts: normal appearance, no masses or tenderness, status post left mastectomy Cardio: regular rate and rhythm, S1, S2 normal, no murmur, click, rub or gallop Pulses: 2+ and symmetric Lymph nodes: Cervical,  supraclavicular, and axillary nodes normal. Neurologic: Grossly normal   Lab Results: Lab Results  Component Value Date   WBC 3.6* 03/23/2012   HGB 13.1 03/23/2012   HCT 38.3 03/23/2012   MCV 92.0 03/23/2012   PLT 214 03/23/2012     Chemistry      Component Value Date/Time   NA 141 03/23/2012 0909   K 4.2 03/23/2012 0909   CL 105 03/23/2012 0909   CO2 25 03/23/2012 0909   BUN 11 03/23/2012 0909   CREATININE 0.84 03/23/2012 0909      Component Value Date/Time   CALCIUM 10.3 03/23/2012 0909   ALKPHOS 58 03/23/2012 0909   AST 18 03/23/2012 0909   ALT 13 03/23/2012 0909   BILITOT 0.4 03/23/2012 0909       Radiological Studies:  No results found.   IMPRESSIONS AND PLAN: A 66 y.o. 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 Currently on Femara tolerating it well. Bone density shows mild osteopenia. Vitamin D is excellent.   I will see her in 6 months time.  Spent more than half the time coordinating care.    Gloria Jackson 5/1/20131:54 AM

## 2012-03-31 ENCOUNTER — Other Ambulatory Visit: Payer: Self-pay | Admitting: *Deleted

## 2012-03-31 DIAGNOSIS — E559 Vitamin D deficiency, unspecified: Secondary | ICD-10-CM

## 2012-03-31 DIAGNOSIS — C50919 Malignant neoplasm of unspecified site of unspecified female breast: Secondary | ICD-10-CM

## 2012-03-31 MED ORDER — LETROZOLE 2.5 MG PO TABS: 2.5000 mg | ORAL_TABLET | Freq: Every day | ORAL | Status: DC

## 2012-04-08 ENCOUNTER — Other Ambulatory Visit: Payer: Medicare Other | Admitting: Lab

## 2012-04-08 ENCOUNTER — Ambulatory Visit: Payer: Medicare Other | Admitting: Physician Assistant

## 2012-04-15 ENCOUNTER — Ambulatory Visit: Payer: Medicare Other | Admitting: Physician Assistant

## 2012-04-15 ENCOUNTER — Other Ambulatory Visit: Payer: Medicare Other | Admitting: Lab

## 2012-05-31 ENCOUNTER — Other Ambulatory Visit: Payer: Self-pay | Admitting: Physician Assistant

## 2012-06-29 ENCOUNTER — Other Ambulatory Visit: Payer: Self-pay | Admitting: Physician Assistant

## 2012-06-29 DIAGNOSIS — C50919 Malignant neoplasm of unspecified site of unspecified female breast: Secondary | ICD-10-CM

## 2012-09-24 ENCOUNTER — Other Ambulatory Visit (HOSPITAL_BASED_OUTPATIENT_CLINIC_OR_DEPARTMENT_OTHER): Payer: Medicare Other | Admitting: Lab

## 2012-09-24 ENCOUNTER — Ambulatory Visit (HOSPITAL_BASED_OUTPATIENT_CLINIC_OR_DEPARTMENT_OTHER): Payer: Medicare Other | Admitting: Oncology

## 2012-09-24 ENCOUNTER — Telehealth: Payer: Self-pay | Admitting: Oncology

## 2012-09-24 VITALS — BP 148/75 | HR 84 | Temp 98.9°F | Resp 20 | Ht 67.5 in | Wt 157.8 lb

## 2012-09-24 DIAGNOSIS — E559 Vitamin D deficiency, unspecified: Secondary | ICD-10-CM

## 2012-09-24 DIAGNOSIS — C50919 Malignant neoplasm of unspecified site of unspecified female breast: Secondary | ICD-10-CM

## 2012-09-24 DIAGNOSIS — C50519 Malignant neoplasm of lower-outer quadrant of unspecified female breast: Secondary | ICD-10-CM

## 2012-09-24 LAB — CBC WITH DIFFERENTIAL/PLATELET
Basophils Absolute: 0 10*3/uL (ref 0.0–0.1)
EOS%: 3.5 % (ref 0.0–7.0)
Eosinophils Absolute: 0.1 10*3/uL (ref 0.0–0.5)
HCT: 38.6 % (ref 34.8–46.6)
HGB: 13.3 g/dL (ref 11.6–15.9)
MCH: 31.7 pg (ref 25.1–34.0)
MCV: 91.8 fL (ref 79.5–101.0)
NEUT#: 2.3 10*3/uL (ref 1.5–6.5)
NEUT%: 54.7 % (ref 38.4–76.8)
lymph#: 1.3 10*3/uL (ref 0.9–3.3)

## 2012-09-24 LAB — COMPREHENSIVE METABOLIC PANEL (CC13)
Albumin: 4.1 g/dL (ref 3.5–5.0)
Alkaline Phosphatase: 65 U/L (ref 40–150)
BUN: 11 mg/dL (ref 7.0–26.0)
CO2: 31 mEq/L — ABNORMAL HIGH (ref 22–29)
Calcium: 10.4 mg/dL (ref 8.4–10.4)
Chloride: 106 mEq/L (ref 98–107)
Glucose: 71 mg/dl (ref 70–99)
Potassium: 4.7 mEq/L (ref 3.5–5.1)
Total Protein: 7 g/dL (ref 6.4–8.3)

## 2012-09-24 MED ORDER — TAMOXIFEN CITRATE 10 MG PO TABS: 20.0000 mg | ORAL_TABLET | Freq: Every day | ORAL | Status: DC

## 2012-09-24 NOTE — Telephone Encounter (Signed)
gve the pt her may 2014 appt calendar 

## 2012-09-24 NOTE — Patient Instructions (Addendum)
peridin c , take 2 pills 3 x /day

## 2012-09-24 NOTE — Progress Notes (Signed)
Hematology and Oncology Follow Up Visit  Gloria Jackson [id number] 1946-11-15 66 y.o. 09/24/2012 5:58 PM   DIAGNOSIS: 66 year old woman with history of breast cancer status post treatment with both surgery chemotherapy and radiation  Encounter Diagnoses  Name Primary?  . Malignant neoplasm of breast (female), unspecified site Yes  . Unspecified vitamin D deficiency      PAST THERAPY: [facility] x6 completed September 2009, ration therapy completed March 2010, on adjuvant Femara therapy   Interim History: Patient returns for followup. She is little anxious. She continues to have joint pains and losing her hair which she is concerned about. She describes her menorrhagia to be on the Femara. She is also having hot flashes. Medications: I have reviewed the patient's current medications.  Allergies: No Known Allergies  Past Medical History, Surgical history, Social history, and Family History were reviewed and updated.  Review of Systems: Constitutional:  Negative for fever, chills, night sweats, anorexia, weight loss, pain. Cardiovascular: no chest pain or dyspnea on exertion Respiratory: negative Neurological: negative Dermatological: negative ENT: negative Skin Gastrointestinal: negative Genito-Urinary: negative Hematological and Lymphatic: negative Breast: negative Musculoskeletal: negative Remaining ROS negative.  Physical Exam:  Blood pressure 148/75, pulse 84, temperature 98.9 F (37.2 C), temperature source Oral, resp. rate 20, height 5' 7.5" (1.715 m), weight 157 lb 12.8 oz (71.578 kg).  ECOG:    General appearance: alert, cooperative and appears stated age Eyes: conjunctivae/corneas clear. PERRL, EOM's intact. Fundi benign. Throat: lips, mucosa, and tongue normal; teeth and gums normal Resp: clear to auscultation bilaterally and normal percussion bilaterally Breasts: normal appearance, no masses or tenderness, status post left mastectomy, chest wall exam  unremarkable Cardio: regular rate and rhythm, S1, S2 normal, no murmur, click, rub or gallop Pulses: 2+ and symmetric Lymph nodes: Cervical, supraclavicular, and axillary nodes normal. Neurologic: Grossly normal   Lab Results: Lab Results  Component Value Date   WBC 4.2 09/24/2012   HGB 13.3 09/24/2012   HCT 38.6 09/24/2012   MCV 91.8 09/24/2012   PLT 251 09/24/2012     Chemistry      Component Value Date/Time   NA 141 09/24/2012 0933   NA 141 03/23/2012 0909   K 4.7 09/24/2012 0933   K 4.2 03/23/2012 0909   CL 106 09/24/2012 0933   CL 105 03/23/2012 0909   CO2 31* 09/24/2012 0933   CO2 25 03/23/2012 0909   BUN 11.0 09/24/2012 0933   BUN 11 03/23/2012 0909   CREATININE 0.8 09/24/2012 0933   CREATININE 0.84 03/23/2012 0909      Component Value Date/Time   CALCIUM 10.4 09/24/2012 0933   CALCIUM 10.3 03/23/2012 0909   ALKPHOS 65 09/24/2012 0933   ALKPHOS 58 03/23/2012 0909   AST 19 09/24/2012 0933   AST 18 03/23/2012 0909   ALT 18 09/24/2012 0933   ALT 13 03/23/2012 0909   BILITOT 0.39 09/24/2012 0933   BILITOT 0.4 03/23/2012 1191       Radiological Studies:  No results found.   IMPRESSIONS AND PLAN: A 66 y.o. 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 Currently on Femara. We discussed the possible switch to Femara at 2 tamoxifen. This may cause less hair loss no hot flashes may still be an issue. I recommended tendency for hot flashes. I will plan to see her back in followup in 6 months. We reviewed side effects tamoxifen therapy she has had  a hysterectomy. She still has a small risk for blood clots.  Limits but the patient half the time and patient-related counseling  Gloria Jackson 11/1/20135:58 PM

## 2012-09-28 ENCOUNTER — Other Ambulatory Visit: Payer: Self-pay | Admitting: *Deleted

## 2012-09-28 DIAGNOSIS — C50919 Malignant neoplasm of unspecified site of unspecified female breast: Secondary | ICD-10-CM

## 2012-09-28 DIAGNOSIS — E559 Vitamin D deficiency, unspecified: Secondary | ICD-10-CM

## 2012-09-28 MED ORDER — TAMOXIFEN CITRATE 20 MG PO TABS: 20.0000 mg | ORAL_TABLET | Freq: Every day | ORAL | Status: DC

## 2013-01-26 ENCOUNTER — Telehealth: Payer: Self-pay | Admitting: *Deleted

## 2013-01-26 NOTE — Telephone Encounter (Signed)
Called and spoke with patient to reschedule her appt. Confirmed appt. With Bernell List on 03/31/13 at 0900.  Then will become Dr. Darnelle Catalan.

## 2013-01-31 ENCOUNTER — Encounter: Payer: Self-pay | Admitting: Oncology

## 2013-03-29 ENCOUNTER — Other Ambulatory Visit: Payer: Medicare Other | Admitting: Lab

## 2013-03-29 ENCOUNTER — Ambulatory Visit: Payer: Medicare Other | Admitting: Oncology

## 2013-03-31 ENCOUNTER — Ambulatory Visit (HOSPITAL_BASED_OUTPATIENT_CLINIC_OR_DEPARTMENT_OTHER): Payer: Medicare Other | Admitting: Family

## 2013-03-31 ENCOUNTER — Telehealth: Payer: Self-pay | Admitting: Oncology

## 2013-03-31 ENCOUNTER — Encounter: Payer: Self-pay | Admitting: Family

## 2013-03-31 ENCOUNTER — Ambulatory Visit (HOSPITAL_BASED_OUTPATIENT_CLINIC_OR_DEPARTMENT_OTHER): Payer: Medicare Other | Admitting: Lab

## 2013-03-31 VITALS — BP 129/77 | HR 83 | Temp 98.3°F | Resp 20 | Ht 67.5 in | Wt 161.2 lb

## 2013-03-31 DIAGNOSIS — C50519 Malignant neoplasm of lower-outer quadrant of unspecified female breast: Secondary | ICD-10-CM

## 2013-03-31 DIAGNOSIS — M8580 Other specified disorders of bone density and structure, unspecified site: Secondary | ICD-10-CM

## 2013-03-31 DIAGNOSIS — E559 Vitamin D deficiency, unspecified: Secondary | ICD-10-CM

## 2013-03-31 DIAGNOSIS — C50919 Malignant neoplasm of unspecified site of unspecified female breast: Secondary | ICD-10-CM

## 2013-03-31 DIAGNOSIS — C50912 Malignant neoplasm of unspecified site of left female breast: Secondary | ICD-10-CM

## 2013-03-31 DIAGNOSIS — Z170 Estrogen receptor positive status [ER+]: Secondary | ICD-10-CM

## 2013-03-31 DIAGNOSIS — M949 Disorder of cartilage, unspecified: Secondary | ICD-10-CM

## 2013-03-31 LAB — COMPREHENSIVE METABOLIC PANEL (CC13)
ALT: 15 U/L (ref 0–55)
AST: 19 U/L (ref 5–34)
Albumin: 3.7 g/dL (ref 3.5–5.0)
Alkaline Phosphatase: 44 U/L (ref 40–150)
Calcium: 9.2 mg/dL (ref 8.4–10.4)
Chloride: 105 mEq/L (ref 98–107)
Potassium: 4.3 mEq/L (ref 3.5–5.1)

## 2013-03-31 LAB — CBC WITH DIFFERENTIAL/PLATELET
BASO%: 1.3 % (ref 0.0–2.0)
EOS%: 7.4 % — ABNORMAL HIGH (ref 0.0–7.0)
MCH: 31.3 pg (ref 25.1–34.0)
MCHC: 33.9 g/dL (ref 31.5–36.0)
MONO#: 0.4 10*3/uL (ref 0.1–0.9)
RBC: 4.14 10*6/uL (ref 3.70–5.45)
RDW: 14.1 % (ref 11.2–14.5)
WBC: 4.4 10*3/uL (ref 3.9–10.3)
lymph#: 1.5 10*3/uL (ref 0.9–3.3)

## 2013-03-31 MED ORDER — TAMOXIFEN CITRATE 20 MG PO TABS: 20.0000 mg | ORAL_TABLET | Freq: Every day | ORAL | Status: DC

## 2013-03-31 NOTE — Progress Notes (Signed)
[facility]  [phone] [phone]  OFFICE PROGRESS NOTE   ID: Gloria Jackson   DOB: Nov 24, 1946  MR#: [id number]  XLK#:[id number]   PCP: Gloria Gu, MD SU: Gloria Jackson, M.D.   HISTORY OF PRESENT ILLNESS: From Dr. Theron Arista Jackson's new patient evaluation note dated 02/23/2008:  "This is a delightful 67 year old woman from [place], [place] referred by Dr. Freida Jackson for evaluation and treatment of breast cancer.  This woman has a mammogram performed every year.  She had a routine screening mammogram at [facility] on 01/26/2008.  This essentially showed amorphous calcifications in the lower outer left breast, some of these were in the area of tissue marker from previous biopsy in 2004.  There seemed to be more calcifications so biopsy was recommended.  A biopsy on 02/03/2008 at 4 to 5 o'clock showed invasive ductal cancer with microcalcifications.  This appeared to be a grade 2 or 3 tumor, 100% ER, PR 0%, proliferative index 23%, HER-2 was 2+.  Magnification views done of the left breast showed distribution of calcifications measuring 3.2 x 4 x 4.4.  There was a multilobulated mass within the calcification cluster measuring 1.7 x 2.4 x 1.5 cm.  Again this was the area that was biopsied and appeared to be different than before.  The area was checked for HER-2 by FISH and was slightly above the ratio of 3.2.  Breast specific gamma imaging was also done on 02/03/2008.  There were two areas in the lower outer left breast, the largest one measuring 0.9 cm in greatest dimension and medially inferior a smaller area of mildly increased uptake.  A followup MRI scan of the breast was done on bilateral breasts on February 21, 2008.  This showed abnormal enhancement in the lower portion of the left breast measuring 4.3 cm.  There is prominent left axillary adenopathy as well.  Patient has been referred for consideration of port placement and consideration of and/or adjuvant therapy."   Her subsequent history is as detailed below.  INTERVAL HISTORY: Dr. 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  The patient was last seen by Dr. Donnie Jackson on 09/24/2012.   Since her last office visit, the patient has been doing relatively well.  She is establishing herself with Dr. Darrall Jackson service today.  REVIEW OF SYSTEMS: A 10 point review of systems was completed and is negative except for complaints of fatigue, a continuous sore throat since her antiestrogen therapy was changed to Tamoxifen in 09/2012 and ongoing hot flashes.   The patient does note that her hair loss has improved with the use of Tamoxifen, than with her previous antiestrogen therapy with Femara.  The patient denies any other symptomatology.   PAST MEDICAL HISTORY: Past Medical History  Diagnosis Date  . Breast cancer 01/2007    Left breast  . Osteopenia   . Glaucoma   . 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 Basal cell carcinoma of nose     Nose, arm, back    PAST SURGICAL HISTORY: Past Surgical History  Procedure Laterality Date  . Breast lumpectomy w/ needle localization Left 03/12/2007  . Mastectomy Left 03/24/2008  . Abdominal hysterectomy      TAH BSO  . Tonsillectomy    . Port-a-cath removal    . Basal cell carcinoma excision  09/03/2010    Nose   She had a benign biopsy of  the same breast in 2004, status post TAH BSO in 2003, tonsillectomy at age 68.    FAMILY HISTORY Family History  Problem Relation Age of Onset  . Hypertension Mother   . Osteoporosis Mother   . Glaucoma Mother   . Heart attack Father   . Osteoporosis Sister   . Melanoma Sister   Mother had mastectomy for what is called atypical cells and since deceased.  Father is deceased at age 68 from myocardial infarction.  She has a sister alive with history of melanoma.  GYNECOLOGIC HISTORY: Gravida 3, para 2, one miscarriage, menarche at age 75, hysterectomy in  2003, history of endometrial cancer status post hysterectomy, no other therapy, was on hormone replacement therapy for three years  SOCIAL HISTORY: She is originally from [place], [place].  She and her husband [name] have been married for over 45 years.  She is retired from the [employer], as a Pharmacist, hospital. Her husband is retired from the [employer].  She has 2 adult sons.  One of her sons lives in the [place], [place] area and her other son lives in [place].  She has 4 grandsons.  In her spare time she enjoys reading, exercising, and spending time with her family.    ADVANCED DIRECTIVES:  The patient has a power of attorney document on file that was signed on 03/15/2007 in which her husband [name] are designated as her POAs in that order.  HEALTH MAINTENANCE: History  Substance Use Topics  . Smoking status: Never Smoker   . Smokeless tobacco: Never Used  . Alcohol Use: Not on file     Comment: Occasionally    Colonoscopy:  Not on file PAP:  Not on file Bone density:  The patient's last bone density of scan was on 08/12/2011 which showed a T score of -1.7 (osteopenia). Lipid panel:  Not on file  No Known Allergies  Current Outpatient Prescriptions  Medication Sig Dispense Refill  . aspirin 81 MG tablet Take 81 mg by mouth daily.      . B Complex Vitamins (B COMPLEX 1 PO) Take 1 tablet by mouth daily.       Marland Kitchen Bioflavonoid Products (PERIDIN-C PO) Take 2 tablets by mouth daily.      Marland Kitchen CALCIUM CITRATE-VITAMIN D PO Take 500-800 tablets by mouth daily.      . Calcium-Vitamin D-Vitamin K 500-500-40 MG-UNT-MCG CHEW Chew 1 capsule by mouth 2 (two) times daily.      . cholecalciferol (VITAMIN D) 1000 UNITS tablet Take 1,000 Units by mouth daily.      . Coenzyme Q10 (CO Q 10 PO) Take 1 capsule by mouth every other day.      . Fiber TABS Take 4 tablets by mouth  daily.      . fish oil-omega-3 fatty acids 1000 MG capsule Take 1 g by mouth daily.       Marland Kitchen latanoprost (XALATAN) 0.005 % ophthalmic solution Place 1 drop into both eyes daily.       . Multiple Vitamin (MULTI-VITAMIN PO) Take 1 tablet by mouth daily.       . tamoxifen (NOLVADEX) 20 MG tablet Take 1 tablet (20 mg total) by mouth daily.  90 tablet  3   No current facility-administered medications for this visit.    OBJECTIVE: Filed Vitals:   03/31/13 0902  BP: 129/77  Pulse: 83  Temp: 98.3  F (36.8 C)  Resp: 20     Body mass index is 24.86 kg/(m^2).      ECOG FS:  Grade 1 - Symptomatic, but fully ambulatory           General appearance: Alert, cooperative, well nourished, no apparent distress Head: Normocephalic, without obvious abnormality, atraumatic Eyes: Mild arcus senilis, PERRLA, EOMI Nose: Nares, septum and mucosa are normal, no drainage or sinus tenderness Neck: No adenopathy, supple, symmetrical, trachea midline, thyroid not enlarged, no tenderness Resp: Clear to auscultation bilaterally Cardio: Regular rate and rhythm, S1, S2 normal, no murmur, click, rub or gallop Breasts: left breast has been surgically removed, left chest wall and axillary area have well-healed surgical scars, no lymphadenopathy, no nipple inversion, no axilla fullness GI: Soft, distended, non-tender, normoactive bowel sounds, no organomegaly Skin: seborrhea keratosis on cervical and trunk areas Extremities: Extremities normal, atraumatic, no cyanosis or edema Lymph nodes: Cervical, supraclavicular, and axillary nodes normal Neurologic: Grossly normal   LAB RESULTS: Lab Results  Component Value Date   WBC 4.4 03/31/2013   NEUTROABS 2.1 03/31/2013   HGB 13.0 03/31/2013   HCT 38.3 03/31/2013   MCV 92.4 03/31/2013   PLT 240 03/31/2013      Chemistry      Component Value Date/Time   NA 142 03/31/2013 1117   NA 141 03/23/2012 0909   K 4.3 03/31/2013 1117   K 4.2 03/23/2012 0909   CL 105 03/31/2013 1117   CL  105 03/23/2012 0909   CO2 27 03/31/2013 1117   CO2 25 03/23/2012 0909   BUN 13.5 03/31/2013 1117   BUN 11 03/23/2012 0909   CREATININE 0.9 03/31/2013 1117   CREATININE 0.84 03/23/2012 0909      Component Value Date/Time   CALCIUM 9.2 03/31/2013 1117   CALCIUM 10.3 03/23/2012 0909   ALKPHOS 44 03/31/2013 1117   ALKPHOS 58 03/23/2012 0909   AST 19 03/31/2013 1117   AST 18 03/23/2012 0909   ALT 15 03/31/2013 1117   ALT 13 03/23/2012 0909   BILITOT 0.33 03/31/2013 1117   BILITOT 0.4 03/23/2012 0909       Lab Results  Component Value Date   LABCA2 18 09/08/2011    Urinalysis No results found for this basename: colorurine,  appearanceur,  labspec,  phurine,  glucoseu,  hgbur,  bilirubinur,  ketonesur,  proteinur,  urobilinogen,  nitrite,  leukocytesur    STUDIES: No results found.  ASSESSMENT: 67 y.[place]: 1.  Status post left breast needle core biopsy at the 4-5 o'clock position on 02/03/2008 which showed invasive ductal carcinoma with microcalcifications, ER 100%, PR 0%, Ki-67 23%,HER2/neu at 2+-equivocal.  2.  The patient had a 2D echocardiogram on 02/28/2008 which showed an ejection fraction of 60% to 65%.  3.  Status post left breast needle localized lumpectomy and sentinel node biopsy on 03/06/2008 for a stage I, pT1c pN0, 1.5 cm invasive ductal carcinoma and DCIS, grade 2, ER 100%, PR 0%, Ki-67 23%, HER2/neu at 2+ equivocal, with 0/8 positive lymph nodes.  4.  Status post left breast simple mastectomy with inferior skin excision on 03/24/2008 which showed no residual carcinoma.  5.  The patient underwent adjuvant chemotherapy with TCH from 04/20/2008 through 08/01/2008  6 cycles.therapy with Herceptin continued until 01/16/2009.  6.  The patient began anti-estrogen therapy with Femara in 07/2008 until 09/2012.  Her anti-estrogen therapy was changed to Tamoxifen in 09/2012 due to complaints of weight gain and hair loss.  7.  Osteopenia   PLAN: 1.  The patient  will continue anti-estrogen therapy with Tamoxifen until 07/2013,at which time she will have completed 5 years of antiestrogen therapy.  The patient's last screening mammogram on 03/09/2013 showed no significant change of the right breast compared to previous mammograms and no suspicious findings.  She will be due for another screening mammogram in 02/2014.  2.  The patient's last bone density scan on 08/12/2011 showed a T score of -1.7 (osteopenia).  The patient is currently taking calcium and vitamin D daily.  Her next bone density scan will be due in 07/2013 and we will schedule this for her.  3.  We plan to see Gloria Jackson again in 07/2013 at which time she will be eligible to graduate [facility]'s breast cancer program.  We will check a CBC, CMP, LDH, and vitamin D level at that time.   All questions were answered.  The patient was encouraged to contact us in the interim with any problems, questions or concerns.    Larina Bras, NP-C 04/02/2013, 8:01 PM

## 2013-03-31 NOTE — Telephone Encounter (Signed)
, °

## 2013-03-31 NOTE — Patient Instructions (Addendum)
Please contact us at [phone] if you have any questions or concerns.  Please continue to do well and enjoy life!!!  Get plenty of rest, drink plenty of water, exercise daily, eat a balanced diet.  Find a primary care physician.  Take Tamoxifen 20 mg PO daily

## 2013-04-01 ENCOUNTER — Telehealth: Payer: Self-pay | Admitting: Family

## 2013-04-01 LAB — VITAMIN D 25 HYDROXY (VIT D DEFICIENCY, FRACTURES): Vit D, 25-Hydroxy: 62 ng/mL (ref 30–89)

## 2013-04-01 NOTE — Telephone Encounter (Signed)
Left a message for Gloria Jackson that her labs are normal and to call me back at [phone] if she has any questions.

## 2013-04-27 ENCOUNTER — Telehealth: Payer: Self-pay | Admitting: *Deleted

## 2013-04-27 NOTE — Telephone Encounter (Signed)
Lm informing the pt that [facility] will be running [facility] all day on 08/17/13. gv appt d/t for 08/18/13@10 :30am. Pt is aware that i will be mailing a letter/cal as well...td

## 2013-05-09 ENCOUNTER — Other Ambulatory Visit: Payer: Self-pay | Admitting: Orthopedic Surgery

## 2013-08-12 ENCOUNTER — Other Ambulatory Visit (HOSPITAL_BASED_OUTPATIENT_CLINIC_OR_DEPARTMENT_OTHER): Payer: Medicare Other | Admitting: Lab

## 2013-08-12 ENCOUNTER — Other Ambulatory Visit: Payer: Medicare Other

## 2013-08-12 DIAGNOSIS — C50912 Malignant neoplasm of unspecified site of left female breast: Secondary | ICD-10-CM

## 2013-08-12 DIAGNOSIS — C50519 Malignant neoplasm of lower-outer quadrant of unspecified female breast: Secondary | ICD-10-CM

## 2013-08-12 DIAGNOSIS — C50919 Malignant neoplasm of unspecified site of unspecified female breast: Secondary | ICD-10-CM

## 2013-08-12 LAB — COMPREHENSIVE METABOLIC PANEL (CC13)
AST: 22 U/L (ref 5–34)
Albumin: 3.7 g/dL (ref 3.5–5.0)
Alkaline Phosphatase: 38 U/L — ABNORMAL LOW (ref 40–150)
BUN: 8.9 mg/dL (ref 7.0–26.0)
Calcium: 9.6 mg/dL (ref 8.4–10.4)
Chloride: 107 mEq/L (ref 98–109)
Creatinine: 0.9 mg/dL (ref 0.6–1.1)
Glucose: 78 mg/dl (ref 70–140)
Potassium: 4.5 mEq/L (ref 3.5–5.1)

## 2013-08-12 LAB — CBC WITH DIFFERENTIAL/PLATELET
Basophils Absolute: 0 10*3/uL (ref 0.0–0.1)
EOS%: 6.5 % (ref 0.0–7.0)
Eosinophils Absolute: 0.2 10*3/uL (ref 0.0–0.5)
HGB: 13 g/dL (ref 11.6–15.9)
LYMPH%: 38.3 % (ref 14.0–49.7)
MCH: 30.3 pg (ref 25.1–34.0)
MCV: 90.7 fL (ref 79.5–101.0)
MONO%: 12.2 % (ref 0.0–14.0)
NEUT#: 1.3 10*3/uL — ABNORMAL LOW (ref 1.5–6.5)
Platelets: 201 10*3/uL (ref 145–400)
RBC: 4.27 10*6/uL (ref 3.70–5.45)
RDW: 14.5 % (ref 11.2–14.5)

## 2013-08-15 ENCOUNTER — Ambulatory Visit
Admission: RE | Admit: 2013-08-15 | Discharge: 2013-08-15 | Disposition: A | Discharge status: Home / Self Care | Payer: Medicare Other | Source: Ambulatory Visit | Attending: Family | Admitting: Family

## 2013-08-15 DIAGNOSIS — M8580 Other specified disorders of bone density and structure, unspecified site: Secondary | ICD-10-CM

## 2013-08-17 ENCOUNTER — Ambulatory Visit: Payer: Medicare Other | Admitting: Oncology

## 2013-08-18 ENCOUNTER — Telehealth: Payer: Self-pay | Admitting: Family

## 2013-08-18 ENCOUNTER — Other Ambulatory Visit: Payer: Self-pay | Admitting: Oncology

## 2013-08-18 ENCOUNTER — Ambulatory Visit (HOSPITAL_BASED_OUTPATIENT_CLINIC_OR_DEPARTMENT_OTHER): Payer: Medicare Other | Admitting: Oncology

## 2013-08-18 VITALS — BP 138/77 | HR 83 | Temp 98.0°F | Resp 18 | Ht 67.5 in | Wt 158.5 lb

## 2013-08-18 DIAGNOSIS — C50912 Malignant neoplasm of unspecified site of left female breast: Secondary | ICD-10-CM

## 2013-08-18 DIAGNOSIS — C50919 Malignant neoplasm of unspecified site of unspecified female breast: Secondary | ICD-10-CM

## 2013-08-18 NOTE — Addendum Note (Signed)
Addended by: Billey Co on: 08/18/2013 05:19 PM   Modules accepted: Orders, Medications

## 2013-08-18 NOTE — Telephone Encounter (Signed)
Spoke to Ms. Bohman about her laboratories and recent bone density scan.  Bone density scan showed a T score of -1.5 osteopenia.  Asked her to continue taking vitamin D and calcium daily in addition to walking daily.  She voiced understanding and stated she graduated the [facility] breast cancer program today.

## 2013-08-18 NOTE — Progress Notes (Signed)
[facility]  [phone] [phone]  OFFICE PROGRESS NOTE   ID: Gloria Jackson   DOB: 09-Oct-1946  MR#: [id number]  JYN#:[id number]  PCP: Raechel Chute  HISTORY OF PRESENT ILLNESS: From Dr. Theron Arista Rubin's new patient evaluation note dated 02/23/2008:   "This is a delightful 67 year old woman from [place], [place] referred by Dr. Freida Busman for evaluation and treatment of breast cancer.  This woman has a mammogram performed every year.  She had a routine screening mammogram at [place] on 01/26/2008.  This essentially showed amorphous calcifications in the lower outer left breast, some of these were in the area of tissue marker from previous biopsy in 2004.  There seemed to be more calcifications so biopsy was recommended.  A biopsy on 02/03/2008 at 4 to 5 o'clock showed invasive ductal cancer with microcalcifications.  This appeared to be a grade 2 or 3 tumor, 100% ER, PR 0%, proliferative index 23%, HER-2 was 2+.  Magnification views done of the left breast showed distribution of calcifications measuring 3.2 x 4 x 4.4.  There was a multilobulated mass within the calcification cluster measuring 1.7 x 2.4 x 1.5 cm.  Again this was the area that was biopsied and appeared to be different than before.  The area was checked for HER-2 by FISH and was slightly above the ratio of 3.2.  Breast specific gamma imaging was also done on 02/03/2008.  There were two areas in the lower outer left breast, the largest one measuring 0.9 cm in greatest dimension and medially inferior a smaller area of mildly increased uptake.  A followup MRI scan of the breast was done on bilateral breasts on February 21, 2008.  This showed abnormal enhancement in the lower portion of the left breast measuring 4.3 cm.  There is prominent left axillary adenopathy as well.  Patient has been referred for consideration of port placement and consideration of and/or adjuvant therapy."    Her subsequent  history is as detailed below.  INTERVAL HISTORY: Gloria Jackson returns today for followup of her breast cancer. Interval history is unremarkable. She goes to the wife 3 times a week and whenever she can she visits her grandchildren in [place] and in the [place] area  REVIEW OF SYSTEMS: She is tolerating the tamoxifen well, with hot flashes as her only significant side effect. These improve in the winter. She's had no vaginal wetness or significant vaginal atrophy issues. She denies headaches, visual changes, cough, phlegm production, pleurisy, chest pain or pressure, or change in bowel or bladder habits. She tells me she has a new diagnosis of local my, which is being treated. A detailed review of systems today was otherwise noncontributory   PAST MEDICAL HISTORY: Past Medical History  Diagnosis Date  . Breast cancer 01/2007    Left breast  . Osteopenia   . Glaucoma   . 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 Basal cell carcinoma of nose     Nose, arm, back    PAST SURGICAL HISTORY: Past Surgical History  Procedure Laterality Date  . Breast lumpectomy w/ needle localization Left 03/12/2007  . Mastectomy Left 03/24/2008  . Abdominal hysterectomy      TAH BSO  . Tonsillectomy    . Port-a-cath removal    . Basal cell carcinoma excision  09/03/2010    Nose   She had a benign biopsy of the same breast in 2004, status post TAH BSO in 2003, tonsillectomy at age  3.    FAMILY HISTORY Family History  Problem Relation Age of Onset  . Hypertension Mother   . Osteoporosis Mother   . Glaucoma Mother   . Heart attack Father   . Osteoporosis Sister   . Melanoma Sister   Mother had mastectomy for what is called atypical cells and since deceased.  Father is deceased at age 53 from myocardial infarction.  She has a sister alive with history of melanoma.  GYNECOLOGIC HISTORY: Gravida 3, para 2, one miscarriage, menarche at age 66, hysterectomy and bilateral salpingo-oophorectomy in 2003,  history of endometrial cancer status post hysterectomy, no other therapy, was on hormone replacement therapy for three years  SOCIAL HISTORY: She is originally from [place], [place].  She and her husband [name] have been married for over 45 years.  She is retired from the [employer], as a Pharmacist, hospital. Her husband is retired from the [employer].  She has 2 adult sons.  One of her sons lives in the [place], [place] area and her other son lives in [place].  She has 4 grandsons.  In her spare time she enjoys reading, exercising, and spending time with her family.    ADVANCED DIRECTIVES:  The patient has a power of attorney document on file that was signed on 03/15/2007 in which her husband Gloria Jackson and then her sons Gloria Jackson and Gloria Jackson are designated as her POAs in that order.  HEALTH MAINTENANCE: History  Substance Use Topics  . Smoking status: Never Smoker   . Smokeless tobacco: Never Used  . Alcohol Use: Not on file     Comment: Occasionally    Colonoscopy:  Not on file PAP:  Not on file Bone density:  08/15/2013, lowest T score -1.5 Lipid panel:  Not on file  No Known Allergies  Current Outpatient Prescriptions  Medication Sig Dispense Refill  . aspirin 81 MG tablet Take 81 mg by mouth daily.      . B Complex Vitamins (B COMPLEX 1 PO) Take 1 tablet by mouth daily.       Marland Kitchen Bioflavonoid Products (PERIDIN-C PO) Take 2 tablets by mouth daily.      Marland Kitchen CALCIUM CITRATE-VITAMIN D PO Take 500-800 tablets by mouth daily.      . Calcium-Vitamin D-Vitamin K 500-500-40 MG-UNT-MCG CHEW Chew 1 capsule by mouth 2 (two) times daily.      . cholecalciferol (VITAMIN D) 1000 UNITS tablet Take 1,000 Units by mouth daily.      . Coenzyme Q10 (CO Q 10 PO) Take 1 capsule by mouth every other day.      . Fiber TABS Take 4 tablets by mouth daily.      . fish oil-omega-3 fatty acids 1000 MG capsule Take 1 g by mouth  daily.       Marland Kitchen latanoprost (XALATAN) 0.005 % ophthalmic solution Place 1 drop into both eyes daily.       . Multiple Vitamin (MULTI-VITAMIN PO) Take 1 tablet by mouth daily.       . tamoxifen (NOLVADEX) 20 MG tablet Take 1 tablet (20 mg total) by mouth daily.  90 tablet  3   No current facility-administered medications for this visit.    OBJECTIVE: Middle-aged white woman in no acute distress Filed Vitals:   08/18/13 1018  BP: 138/77  Pulse: 83  Temp: 98 F (36.7 C)  Resp: 18     Body mass index is 24.44 kg/(m^2).  ECOG FS:  Grade 0           Sclerae unicteric Oropharynx clear No cervical or supraclavicular adenopathy Lungs no rales or rhonchi Heart regular rate and rhythm Abd benign MSK no focal spinal tenderness, no peripheral edema Neuro: nonfocal Breasts: The right breast shows no skin or nipple changes of concern. The right axilla is benign. The left breast is status post mastectomy. There is no evidence of local recurrence. The left axilla is benign.    LAB RESULTS: Lab Results  Component Value Date   WBC 3.1* 08/12/2013   NEUTROABS 1.3* 08/12/2013   HGB 13.0 08/12/2013   HCT 38.7 08/12/2013   MCV 90.7 08/12/2013   PLT 201 08/12/2013      Chemistry      Component Value Date/Time   NA 142 08/12/2013 0922   NA 141 03/23/2012 0909   K 4.5 08/12/2013 0922   K 4.2 03/23/2012 0909   CL 105 03/31/2013 1117   CL 105 03/23/2012 0909   CO2 27 08/12/2013 0922   CO2 25 03/23/2012 0909   BUN 8.9 08/12/2013 0922   BUN 11 03/23/2012 0909   CREATININE 0.9 08/12/2013 0922   CREATININE 0.84 03/23/2012 0909      Component Value Date/Time   CALCIUM 9.6 08/12/2013 0922   CALCIUM 10.3 03/23/2012 0909   ALKPHOS 38* 08/12/2013 0922   ALKPHOS 58 03/23/2012 0909   AST 22 08/12/2013 0922   AST 18 03/23/2012 0909   ALT 15 08/12/2013 0922   ALT 13 03/23/2012 0909   BILITOT 0.27 08/12/2013 0922   BILITOT 0.4 03/23/2012 0909       Lab Results  Component Value Date   LABCA2 18 09/08/2011     Urinalysis No results found for this basename: colorurine,  appearanceur,  labspec,  phurine,  glucoseu,  hgbur,  bilirubinur,  ketonesur,  proteinur,  urobilinogen,  nitrite,  leukocytesur    STUDIES: Dg Bone Density  08/15/2013   *RADIOLOGY REPORT*  Clinical Data: 67 year old postmenopausal female with history of low bone mass and breast cancer.  The patient took Femara for 5 years.  She started taking Tamoxifen and April 2014.  DUAL X-RAY ABSORPTIOMETRY (DXA) FOR BONE MINERAL DENSITY  AP LUMBAR SPINE (L1 - L4)  Bone Mineral Density (BMD):            0.923 g/cm2 Young Adult T Score:                          -1.1 Z Score:                                                0.9  LEFT FEMUR (NECK)  Bone Mineral Density (BMD):             0.684 g/cm2 Young Adult T Score:                           -1.5 Z Score:                                                 0.2  ASSESSMENT:  Patient's diagnostic category is LOW BONE  MASS by WHO Criteria.  FRACTURE RISK: MODERATE  FRAX: Based on the World Health Organization FRAX model, the 10 year probability of a major osteoporotic fracture is 9.8%.  The 10 year probability of a hip fracture is 1.3%.  Comparison: There has been no significant change in BMD in the spine and a statistically significant 4.9% decrease in BMD in the total left hip as compared to 08/10/2008.  There has been a statistically significant 4% increase in BMD in the spine and a statistically significant 4.9% decrease in BMD in the total left hip as compared to 08/12/2011.  RECOMMENDATIONS:  All patients should ensure an adequate intake of dietary calcium (1200mg  daily) and vitamin D (800 IU daily) unless contraindicated. The National Osteoporosis Foundation recommends that FDA-approved medical therapies be considered in postmenopausal women and mean age 18 or older with a:  1)    Hip or vertebral (clinical or morphometric) fracture.  2)   T-score of -2.5 or lower at the spine or hip.  3)   Ten-year  fracture probability by FRAX of 3% or greater for hip fracture or 20% or greater for major osteoporotic fracture.  FOLLOW-UP:  People with diagnosed cases of osteoporosis or at high risk for fracture should have regular bone mineral density tests.  For patients eligible for Medicare, routine testing is allowed once every 2 years.  The testing frequency can be increased to one year for patients who have rapidly progressing disease, those who are receiving or discontinuing medical therapy to restore bone mass, or have additional risk factors.   Original Report Authenticated By: Cain Saupe, M.D.    ASSESSMENT: 68 y.[place]:  1.  Status post left breast lumpectomy and axillary lymph node dissection on 03/06/2008 for apT1c pN0,  stage IA invasive ductal carcinoma, grade 2, ER 100%, PR 0%, Ki-67 23%, HER2/neu at 2+ equivocal but amplified by FISH with a ratio of 3.2  4.  Status post left breast simple mastectomy with inferior skin excision on 03/24/2008 which showed no residual carcinoma.  5.  The patient underwent adjuvant chemotherapy with carboplatin, docetaxel, and trastuzumab from 04/20/2008 through 08/01/2008  6 cycles.therapy with trastuzumab continued until 01/16/2009.  6.  The patient began anti-estrogen therapy with letrozole in 07/2008 until 09/2012.  Her anti-estrogen therapy was changed to Tamoxifen in 09/2012, discontinued October 2014  7.  Osteopenia, mild  PLAN: Gloria Jackson has completed her 5 years of anti-estrogen therapy and is now ready to "graduate." She understands she will need a yearly physician breast exam and yearly mammography. We also went over her bone density results, which are favorable, the worst T score being -1.5. I encouraged her to continue her vitamin D supplementation and her exercise program.  We will be glad to see Gloria Jackson again at anytime in the future if and when the need arises, but we are making no further routine appointment for her  here.   Lowella Dell, MD  08/18/2013, 10:34 AM

## 2013-09-20 ENCOUNTER — Ambulatory Visit (INDEPENDENT_AMBULATORY_CARE_PROVIDER_SITE_OTHER): Payer: Medicare Other | Admitting: Internal Medicine

## 2013-09-20 ENCOUNTER — Encounter: Payer: Self-pay | Admitting: Internal Medicine

## 2013-09-20 VITALS — BP 117/74 | HR 80 | Temp 98.8°F | Resp 18 | Wt 161.0 lb

## 2013-09-20 DIAGNOSIS — Z780 Asymptomatic menopausal state: Secondary | ICD-10-CM

## 2013-09-20 DIAGNOSIS — Z9012 Acquired absence of left breast and nipple: Secondary | ICD-10-CM

## 2013-09-20 DIAGNOSIS — C549 Malignant neoplasm of corpus uteri, unspecified: Secondary | ICD-10-CM

## 2013-09-20 DIAGNOSIS — M8580 Other specified disorders of bone density and structure, unspecified site: Secondary | ICD-10-CM | POA: Insufficient documentation

## 2013-09-20 DIAGNOSIS — H409 Unspecified glaucoma: Secondary | ICD-10-CM

## 2013-09-20 DIAGNOSIS — N951 Menopausal and female climacteric states: Secondary | ICD-10-CM

## 2013-09-20 DIAGNOSIS — C541 Malignant neoplasm of endometrium: Secondary | ICD-10-CM

## 2013-09-20 DIAGNOSIS — M1990 Unspecified osteoarthritis, unspecified site: Secondary | ICD-10-CM

## 2013-09-20 DIAGNOSIS — Z9010 Acquired absence of unspecified breast and nipple: Secondary | ICD-10-CM

## 2013-09-20 DIAGNOSIS — M899 Disorder of bone, unspecified: Secondary | ICD-10-CM

## 2013-09-20 LAB — LIPID PANEL
Cholesterol: 174 mg/dL (ref 0–200)
HDL: 56 mg/dL (ref >39)
Triglycerides: 106 mg/dL (ref <150)

## 2013-09-20 NOTE — Progress Notes (Signed)
Subjective:    Patient ID: Gloria Jackson, female    DOB: 1946/06/13, 67 y.o.   MRN: [id number]  HPI  Darel Hong is here as a new pt to establish care.  PMH of stage I A breast cancer S/P Left mastectomy that has completed 5 years of anti-estrogen therapy and has been released from her oncologist.  PMH of osteopenia  Lowest T score -1.5, DJD, endometrial cancer S/P  hyst BSO,  poste menopause cardiac palpitations,  and glaucoma  Pt stopped her Tomoxifen 9/25  Overall doing well she has not had a pap of her vaginal cuff in quite some time.   No Known Allergies Past Medical History  Diagnosis Date  . Osteopenia   . Glaucoma   . Breast cancer 01/2007    Left breast  . 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 Basal cell carcinoma of nose     Nose, arm, back   Past Surgical History  Procedure Laterality Date  . Breast lumpectomy w/ needle localization Left 03/12/2007  . Mastectomy Left 03/24/2008  . Abdominal hysterectomy      TAH BSO  . Tonsillectomy    . Port-a-cath removal    . Basal cell carcinoma excision  09/03/2010    Nose   History   Social History  . Marital Status: Married    Spouse Name: N/A    Number of Children: N/A  . Years of Education: N/A   Occupational History  . Not on file.   Social History Main Topics  . Smoking status: Never Smoker   . Smokeless tobacco: Never Used  . Alcohol Use: Not on file     Comment: Occasionally  . Drug Use: No  . Sexual Activity: Yes    Birth Control/ Protection: Surgical, Post-menopausal   Other Topics Concern  . Not on file   Social History Narrative  . No narrative on file   Family History  Problem Relation Age of Onset  . Hypertension Mother   . Osteoporosis Mother   . Glaucoma Mother   . Heart attack Father   . Osteoporosis Sister   . Melanoma Sister    Patient Active Problem List   Diagnosis Date Noted  . Endometrial cancer 09/20/2013  . DJD (degenerative joint disease) 09/20/2013  . H/O  mastectomy 09/20/2013  . Osteopenia 09/20/2013  . Post-menopause 09/20/2013  . Glaucoma 09/20/2013  . Breast cancer, left breast 03/31/2013   Current Outpatient Prescriptions on File Prior to Visit  Medication Sig Dispense Refill  . aspirin 81 MG tablet Take 81 mg by mouth daily.      . B Complex Vitamins (B COMPLEX 1 PO) Take 1 tablet by mouth daily.       Marland Kitchen CALCIUM CITRATE-VITAMIN D PO Take 500-800 tablets by mouth daily.      . Calcium-Vitamin D-Vitamin K 500-500-40 MG-UNT-MCG CHEW Chew 1 capsule by mouth 2 (two) times daily.      . cholecalciferol (VITAMIN D) 1000 UNITS tablet Take 1,000 Units by mouth daily.      . Coenzyme Q10 (CO Q 10 PO) Take 1 capsule by mouth every other day.      . Fiber TABS Take 4 tablets by mouth daily.      . fish oil-omega-3 fatty acids 1000 MG capsule Take 1 g by mouth daily.       Marland Kitchen latanoprost (XALATAN) 0.005 % ophthalmic solution Place 1 drop into both eyes daily.       Marland Kitchen  Multiple Vitamin (MULTI-VITAMIN PO) Take 1 tablet by mouth daily.        No current facility-administered medications on file prior to visit.      Review of Systems See HPI    Objective:   Physical Exam Physical Exam  Nursing note and vitals reviewed.  Constitutional: She is oriented to person, place, and time. She appears well-developed and well-nourished.  HENT:  Head: Normocephalic and atraumatic.  Cardiovascular: Normal rate and regular rhythm. Exam reveals no gallop and no friction rub.  No murmur heard.  Pulmonary/Chest: Breath sounds normal. She has no wheezes. She has no rales.  Neurological: She is alert and oriented to person, place, and time.  Skin: Skin is warm and dry.  Psychiatric: She has a normal mood and affect. Her behavior is normal.             Assessment & Plan:  Stage 1A  L breast cancer    Completed 5 years of anti-estorgen therapy  UTD with dexa.    Rare hot flush per her report.    DJD  otc nsaid of choice  History of endometrial  cancer.   Will schedule CPE as seh will need vaginal pap  Osteopenia  Calcium/vitamin D  Advised    Glaucoma     Pneumovax today  Schedule CPE  Will get TSH and lipids today

## 2013-09-20 NOTE — Patient Instructions (Signed)
Schedule CPE in 2015  You received a pneumonia vaccine today  Get labs drawn upstairs

## 2013-09-21 ENCOUNTER — Encounter: Payer: Self-pay | Admitting: *Deleted

## 2014-01-17 ENCOUNTER — Encounter: Payer: Self-pay | Admitting: Internal Medicine

## 2014-01-17 ENCOUNTER — Other Ambulatory Visit (HOSPITAL_COMMUNITY)
Admission: RE | Admit: 2014-01-17 | Discharge: 2014-01-17 | Disposition: A | Discharge status: Home / Self Care | Payer: Medicare Other | Source: Ambulatory Visit | Attending: Internal Medicine | Admitting: Internal Medicine

## 2014-01-17 ENCOUNTER — Ambulatory Visit (INDEPENDENT_AMBULATORY_CARE_PROVIDER_SITE_OTHER): Payer: Medicare Other | Admitting: Internal Medicine

## 2014-01-17 VITALS — BP 130/86 | HR 80 | Temp 98.4°F | Resp 18 | Ht 67.5 in | Wt 156.0 lb

## 2014-01-17 DIAGNOSIS — C541 Malignant neoplasm of endometrium: Secondary | ICD-10-CM

## 2014-01-17 DIAGNOSIS — I517 Cardiomegaly: Secondary | ICD-10-CM

## 2014-01-17 DIAGNOSIS — Z1151 Encounter for screening for human papillomavirus (HPV): Secondary | ICD-10-CM

## 2014-01-17 DIAGNOSIS — C549 Malignant neoplasm of corpus uteri, unspecified: Secondary | ICD-10-CM

## 2014-01-17 DIAGNOSIS — Z124 Encounter for screening for malignant neoplasm of cervix: Secondary | ICD-10-CM | POA: Insufficient documentation

## 2014-01-17 DIAGNOSIS — Z1272 Encounter for screening for malignant neoplasm of vagina: Secondary | ICD-10-CM

## 2014-01-17 DIAGNOSIS — Z23 Encounter for immunization: Secondary | ICD-10-CM

## 2014-01-17 DIAGNOSIS — Z0000 Encounter for general adult medical examination without abnormal findings: Secondary | ICD-10-CM

## 2014-01-17 LAB — HEMOCCULT GUIAC POC 1CARD (OFFICE): Fecal Occult Blood, POC: NEGATIVE

## 2014-01-17 LAB — POCT URINALYSIS DIPSTICK
Bilirubin, UA: NEGATIVE
Blood, UA: NEGATIVE
Glucose, UA: NEGATIVE
Ketones, UA: NEGATIVE
LEUKOCYTES UA: NEGATIVE
NITRITE UA: NEGATIVE
PROTEIN UA: NEGATIVE
Spec Grav, UA: 1.01
UROBILINOGEN UA: NEGATIVE
pH, UA: 7

## 2014-01-17 NOTE — Progress Notes (Signed)
Subjective:    Patient ID: Gloria Jackson, female    DOB: [birth year], 68 y.o.   MRN: [id number]  HPI Bethena Roys is here for CPE.    Overall doing well Stage I A breast CA left breast.  She has finished cours of Femara/TAmoxifen.  Last mm neg.    Endometrial cancer.   She was having paps of vaginal cuff but has not had one in many years  DJD  OTC  meds help  Knees bother her the most  Osteopenia  She exercises at [facility]  takingcalcium vitamin D  No Known Allergies Past Medical History  Diagnosis Date  . Osteopenia   . Glaucoma   . Breast cancer 01/2007    Left breast  . 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 Basal cell carcinoma of nose     Nose, arm, back   Past Surgical History  Procedure Laterality Date  . Breast lumpectomy w/ needle localization Left 03/12/2007  . Mastectomy Left 03/24/2008  . Abdominal hysterectomy      TAH BSO  . Tonsillectomy    . Port-a-cath removal    . Basal cell carcinoma excision  09/03/2010    Nose   History   Social History  . Marital Status: Married    Spouse Name: N/A    Number of Children: N/A  . Years of Education: N/A   Occupational History  . Not on file.   Social History Main Topics  . Smoking status: Never Smoker   . Smokeless tobacco: Never Used  . Alcohol Use: Not on file     Comment: Occasionally  . Drug Use: No  . Sexual Activity: Yes    Birth Control/ Protection: Surgical, Post-menopausal   Other Topics Concern  . Not on file   Social History Narrative  . No narrative on file   Family History  Problem Relation Age of Onset  . Hypertension Mother   . Osteoporosis Mother   . Glaucoma Mother   . Heart attack Father   . Osteoporosis Sister   . Melanoma Sister    Patient Active Problem List   Diagnosis Date Noted  . Endometrial cancer 09/20/2013  . DJD (degenerative joint disease) 09/20/2013  . H/O mastectomy 09/20/2013  . Osteopenia 09/20/2013  . Post-menopause 09/20/2013  . Glaucoma 09/20/2013  .  Breast cancer, left breast 03/31/2013   Current Outpatient Prescriptions on File Prior to Visit  Medication Sig Dispense Refill  . aspirin 81 MG tablet Take 81 mg by mouth daily.      . B Complex Vitamins (B COMPLEX 1 PO) Take 1 tablet by mouth daily.       . Calcium-Vitamin D-Vitamin K 500-500-40 MG-UNT-MCG CHEW Chew 1 capsule by mouth 2 (two) times daily.      . cholecalciferol (VITAMIN D) 1000 UNITS tablet Take 1,000 Units by mouth daily.      . Coenzyme Q10 (CO Q 10 PO) Take 1 capsule by mouth every other day.      . Fiber TABS Take 4 tablets by mouth daily.      . fish oil-omega-3 fatty acids 1000 MG capsule Take 1 g by mouth daily.       Marland Kitchen latanoprost (XALATAN) 0.005 % ophthalmic solution Place 1 drop into both eyes daily.       . magnesium 30 MG tablet Take 30 mg by mouth 2 (two) times daily.      . Multiple Vitamin (MULTI-VITAMIN  PO) Take 1 tablet by mouth daily.        No current facility-administered medications on file prior to visit.       Review of Systems  Constitutional: Negative.   Respiratory: Negative for chest tightness and shortness of breath.   Cardiovascular: Negative for chest pain and leg swelling.  Gastrointestinal: Negative for blood in stool and anal bleeding.  All other systems reviewed and are negative.       Objective:   Physical Exam  Physical Exam  Vital signs and nursing note reviewed  Constitutional: She is oriented to person, place, and time. She appears well-developed and well-nourished. She is cooperative.  HENT:  Head: Normocephalic and atraumatic.  Right Ear: Tympanic membrane normal.  Left Ear: Tympanic membrane normal.  Nose: Nose normal.  Mouth/Throat: Oropharynx is clear and moist and mucous membranes are normal. No oropharyngeal exudate or posterior oropharyngeal erythema.  Eyes: Conjunctivae and EOM are normal. Pupils are equal, round, and reactive to light.  Neck: Neck supple. No JVD present. Carotid bruit is not present. No  mass and no thyromegaly present.  Cardiovascular: Regular rhythm, normal heart sounds, intact distal pulses and normal pulses.  Exam reveals no gallop and no friction rub.   No murmur heard. Pulses:      Dorsalis pedis pulses are 2+ on the right side, and 2+ on the left side.  Pulmonary/Chest: Breath sounds normal. She has no wheezes. She has no rhonchi. She has no rales. Right breast exhibits no mass, no nipple discharge and no skin change. Left breast exhibits no mass, no nipple discharge and no skin change.  Abdominal: Soft. Bowel sounds are normal. She exhibits no distension and no mass. There is no hepatosplenomegaly. There is no tenderness. There is no CVA tenderness.  Genitourinary: Rectum normal, vagina normal and uterus normal. Rectal exam shows no mass. Guaiac negative stool. No labial fusion. There is no lesion on the right labia. There is no lesion on the left labia. Cervix exhibits no motion tenderness. Right adnexum displays no mass, no tenderness and no fullness. Left adnexum displays no mass, no tenderness and no fullness. No erythema around the vagina.  Musculoskeletal:       No active synovitis to any joint.    Lymphadenopathy:       Right cervical: No superficial cervical adenopathy present.      Left cervical: No superficial cervical adenopathy present.       Right axillary: No pectoral and no lateral adenopathy present.       Left axillary: No pectoral and no lateral adenopathy present.      Right: No inguinal adenopathy present.       Left: No inguinal adenopathy present.  Neurological: She is alert and oriented to person, place, and time. She has normal strength and normal reflexes. No cranial nerve deficit or sensory deficit. She displays a negative Romberg sign. Coordination and gait normal.  Skin: Skin is warm and dry. No abrasion, no bruising, no ecchymosis and no rash noted. No cyanosis. Nails show no clubbing.  Psychiatric: She has a normal mood and affect. Her speech  is normal and behavior is normal.          Assessment & Plan:   HM  Tdap today  Advised Zoster.  Pap of vagina cuff todayl    Elevated BP  Repeat exam normal  EKG  LVH  Osteopenia  Continue calcium/Vitamin D  Left breast CA  Yearly mm  Endometrial cancer  Yearly  vaginal pap  See me as needed     Assessment & Plan:

## 2014-01-17 NOTE — Patient Instructions (Signed)
See me as needed 

## 2014-01-25 ENCOUNTER — Encounter: Payer: Self-pay | Admitting: Internal Medicine

## 2014-04-10 ENCOUNTER — Encounter: Payer: Self-pay | Admitting: *Deleted

## 2014-09-08 ENCOUNTER — Other Ambulatory Visit: Payer: Self-pay

## 2014-09-25 ENCOUNTER — Encounter: Payer: Self-pay | Admitting: Internal Medicine

## 2015-01-22 ENCOUNTER — Other Ambulatory Visit: Payer: Self-pay | Admitting: *Deleted

## 2015-01-22 DIAGNOSIS — Z0000 Encounter for general adult medical examination without abnormal findings: Secondary | ICD-10-CM

## 2015-01-23 ENCOUNTER — Encounter: Payer: Self-pay | Admitting: Internal Medicine

## 2015-01-23 ENCOUNTER — Encounter: Payer: Self-pay | Admitting: *Deleted

## 2015-01-23 ENCOUNTER — Ambulatory Visit (INDEPENDENT_AMBULATORY_CARE_PROVIDER_SITE_OTHER): Payer: 59 | Admitting: Internal Medicine

## 2015-01-23 VITALS — BP 136/76 | HR 81 | Resp 16 | Ht 65.5 in | Wt 155.0 lb

## 2015-01-23 DIAGNOSIS — Z0000 Encounter for general adult medical examination without abnormal findings: Secondary | ICD-10-CM

## 2015-01-23 DIAGNOSIS — M8580 Other specified disorders of bone density and structure, unspecified site: Secondary | ICD-10-CM

## 2015-01-23 DIAGNOSIS — Z124 Encounter for screening for malignant neoplasm of cervix: Secondary | ICD-10-CM

## 2015-01-23 DIAGNOSIS — C541 Malignant neoplasm of endometrium: Secondary | ICD-10-CM

## 2015-01-23 DIAGNOSIS — Z1211 Encounter for screening for malignant neoplasm of colon: Secondary | ICD-10-CM

## 2015-01-23 DIAGNOSIS — C50912 Malignant neoplasm of unspecified site of left female breast: Secondary | ICD-10-CM

## 2015-01-23 LAB — HEMOCCULT GUIAC POC 1CARD (OFFICE)
Fecal Occult Blood, POC: POSITIVE
OCCULT BLOOD DATE: 3012016

## 2015-01-23 LAB — POCT URINALYSIS DIPSTICK
Bilirubin, UA: NEGATIVE
Glucose, UA: NEGATIVE
Ketones, UA: NEGATIVE
LEUKOCYTES UA: NEGATIVE
Nitrite, UA: NEGATIVE
PH UA: 6.5
Protein, UA: NEGATIVE
RBC UA: NEGATIVE
Spec Grav, UA: 1.01
UROBILINOGEN UA: NEGATIVE

## 2015-01-23 LAB — COMPLETE METABOLIC PANEL WITH GFR
ALK PHOS: 47 U/L (ref 39–117)
ALT: 15 U/L (ref 0–35)
AST: 21 U/L (ref 0–37)
Albumin: 4.2 g/dL (ref 3.5–5.2)
BUN: 10 mg/dL (ref 6–23)
CO2: 27 mEq/L (ref 19–32)
CREATININE: 0.79 mg/dL (ref 0.50–1.10)
Calcium: 9.5 mg/dL (ref 8.4–10.5)
Chloride: 105 mEq/L (ref 96–112)
GFR, Est African American: 88 mL/min
GFR, Est Non African American: 77 mL/min
Glucose, Bld: 94 mg/dL (ref 70–99)
POTASSIUM: 4.8 meq/L (ref 3.5–5.3)
Sodium: 143 mEq/L (ref 135–145)
Total Bilirubin: 0.5 mg/dL (ref 0.2–1.2)
Total Protein: 6.4 g/dL (ref 6.0–8.3)

## 2015-01-23 LAB — CBC WITH DIFFERENTIAL/PLATELET
Basophils Absolute: 0.1 10*3/uL (ref 0.0–0.1)
Basophils Relative: 2 % — ABNORMAL HIGH (ref 0–1)
Eosinophils Absolute: 0.2 10*3/uL (ref 0.0–0.7)
Eosinophils Relative: 4 % (ref 0–5)
HEMATOCRIT: 38.7 % (ref 36.0–46.0)
HEMOGLOBIN: 12.6 g/dL (ref 12.0–15.0)
LYMPHS ABS: 1.5 10*3/uL (ref 0.7–4.0)
Lymphocytes Relative: 35 % (ref 12–46)
MCH: 29.5 pg (ref 26.0–34.0)
MCHC: 32.6 g/dL (ref 30.0–36.0)
MCV: 90.6 fL (ref 78.0–100.0)
MPV: 9.2 fL (ref 8.6–12.4)
Monocytes Absolute: 0.4 10*3/uL (ref 0.1–1.0)
Monocytes Relative: 9 % (ref 3–12)
NEUTROS ABS: 2.2 10*3/uL (ref 1.7–7.7)
NEUTROS PCT: 50 % (ref 43–77)
Platelets: 276 10*3/uL (ref 150–400)
RBC: 4.27 MIL/uL (ref 3.87–5.11)
RDW: 14.1 % (ref 11.5–15.5)
WBC: 4.3 10*3/uL (ref 4.0–10.5)

## 2015-01-23 LAB — LIPID PANEL
Cholesterol: 200 mg/dL (ref 0–200)
HDL: 71 mg/dL (ref >=46)
LDL CALC: 113 mg/dL — AB (ref 0–99)
TRIGLYCERIDES: 80 mg/dL (ref <150)
Total CHOL/HDL Ratio: 2.8 Ratio
VLDL: 16 mg/dL (ref 0–40)

## 2015-01-23 LAB — TSH: TSH: 4.19 u[IU]/mL (ref 0.350–4.500)

## 2015-01-23 NOTE — Progress Notes (Signed)
Subjective:    Patient ID: Gloria Jackson, female    DOB: [birth year], 69 y.o.   MRN: [id number]  HPI 12/2013 note Assessment & Plan:   HM Tdap today Advised Zoster. Pap of vagina cuff todayl   Elevated BP Repeat exam normal EKG LVH  Osteopenia Continue calcium/Vitamin D  Left breast CA Yearly mm  Endometrial cancer Yearly vaginal pap  See me as needed               TODAY  Gloria Jackson is here for CPE  HM:  Needs pap of vaginal cuff today ,  Due for colonoscopy this year (Dr. Shana Chute),   Would like shingles vaccine but unclear if she had chicken pox in childhood.  Pt declines Prevnar 13   Stage IA breast CA  She has completed Tamoxifen/Femara   Endometrial CA   Basal and squamous cell skin Ca  Managed by DR. Altamese Cabal  No Known Allergies Past Medical History  Diagnosis Date  . Osteopenia   . Glaucoma   . Breast cancer 01/2007    Left breast  . 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 Basal cell carcinoma of nose     Nose, arm, back   Past Surgical History  Procedure Laterality Date  . Breast lumpectomy w/ needle localization Left 03/12/2007  . Mastectomy Left 03/24/2008  . Abdominal hysterectomy      TAH BSO  . Tonsillectomy    . Port-a-cath removal    . Basal cell carcinoma excision  09/03/2010    Nose   History   Social History  . Marital Status: Married    Spouse Name: N/A  . Number of Children: N/A  . Years of Education: N/A   Occupational History  . Not on file.   Social History Main Topics  . Smoking status: Never Smoker   . Smokeless tobacco: Never Used  . Alcohol Use: Not on file     Comment: Occasionally  . Drug Use: No  . Sexual Activity: Yes    Birth Control/ Protection: Surgical, Post-menopausal   Other Topics Concern  . Not on file   Social History Narrative   Family History  Problem Relation Age of Onset  . Hypertension Mother   . Osteoporosis Mother   . Glaucoma Mother   . Heart attack Father   .  Osteoporosis Sister   . Melanoma Sister    Patient Active Problem List   Diagnosis Date Noted  . LVH (left ventricular hypertrophy) 01/17/2014  . Endometrial cancer 09/20/2013  . DJD (degenerative joint disease) 09/20/2013  . H/O mastectomy 09/20/2013  . Osteopenia 09/20/2013  . Post-menopause 09/20/2013  . Glaucoma 09/20/2013  . Breast cancer, left breast 03/31/2013   Current Outpatient Prescriptions on File Prior to Visit  Medication Sig Dispense Refill  . aspirin 81 MG tablet Take 81 mg by mouth daily.    . B Complex Vitamins (B COMPLEX 1 PO) Take 1 tablet by mouth daily.     . Calcium-Vitamin D-Vitamin K 500-500-40 MG-UNT-MCG CHEW Chew 1 capsule by mouth 2 (two) times daily.    . cholecalciferol (VITAMIN D) 1000 UNITS tablet Take 1,000 Units by mouth daily.    . Coenzyme Q10 (CO Q 10 PO) Take 1 capsule by mouth every other day.    . Fiber TABS Take 4 tablets by mouth daily.    . fish oil-omega-3 fatty acids 1000 MG capsule Take 1 g by mouth daily.     Marland Kitchen  latanoprost (XALATAN) 0.005 % ophthalmic solution Place 1 drop into both eyes daily.     . magnesium 30 MG tablet Take 30 mg by mouth 2 (two) times daily.    . Multiple Vitamin (MULTI-VITAMIN PO) Take 1 tablet by mouth daily.      No current facility-administered medications on file prior to visit.       Review of Systems  Respiratory: Negative for cough, chest tightness, shortness of breath and wheezing.   Cardiovascular: Negative for chest pain, palpitations and leg swelling.  All other systems reviewed and are negative.      Objective:   Physical Exam Physical Exam  Vital signs and nursing note reviewed  Constitutional: She is oriented to person, place, and time. She appears well-developed and well-nourished. She is cooperative.  HENT:  Head: Normocephalic and atraumatic.  Right Ear: Tympanic membrane normal.  Left Ear: Tympanic membrane normal.  Nose: Nose normal.  Mouth/Throat: Oropharynx is clear and moist  and mucous membranes are normal. No oropharyngeal exudate or posterior oropharyngeal erythema.  Eyes: Conjunctivae and EOM are normal. Pupils are equal, round, and reactive to light.  Neck: Neck supple. No JVD present. Carotid bruit is not present. No mass and no thyromegaly present.  Cardiovascular: Regular rhythm, normal heart sounds, intact distal pulses and normal pulses.  Exam reveals no gallop and no friction rub.   No murmur heard. Pulses:      Dorsalis pedis pulses are 2+ on the right side, and 2+ on the left side.  Pulmonary/Chest: Breath sounds normal. She has no wheezes. She has no rhonchi. She has no rales. Right breast exhibits no mass, no nipple discharge and no skin change.  Left chest wal well healed mastectomy scar  No nodule of chest wll  Abdominal: Soft. Bowel sounds are normal. She exhibits no distension and no mass. There is no hepatosplenomegaly. There is no tenderness. There is no CVA tenderness. Rectal ext hemm  Hemocult positive  Genitourinary: Rectum normal, vagina normal and uterus normal. Rectal exam shows no mass. Guaiac negative stool. No labial fusion. There is no lesion on the right labia. There is no lesion on the left labia. Cervix exhibits no motion tenderness. Right adnexum displays no mass, no tenderness and no fullness. Left adnexum displays no mass, no tenderness and no fullness. No erythema around the vagina.  Musculoskeletal:       No active synovitis to any joint.    Lymphadenopathy:       Right cervical: No superficial cervical adenopathy present.      Left cervical: No superficial cervical adenopathy present.       Right axillary: No pectoral and no lateral adenopathy present.       Left axillary: No pectoral and no lateral adenopathy present.      Right: No inguinal adenopathy present.       Left: No inguinal adenopathy present.  Neurological: She is alert and oriented to person, place, and time. She has normal strength and normal reflexes. No cranial  nerve deficit or sensory deficit. She displays a negative Romberg sign. Coordination and gait normal.  Skin: Skin is warm and dry. No abrasion, no bruising, no ecchymosis and no rash noted. No cyanosis. Nails show no clubbing.  Psychiatric: She has a normal mood and affect. Her speech is normal and behavior is normal.          Assessment & Plan:   HM  Pap of vag cuff today .  Will check varicella IGG  prior to Zostavax.  Pt will call GI office to schedule colonoscopy    She is a nonsmoker  She declines prevnar  Hemocult post  See above  Pt informed and she wishes to make her own appt with GI  Dr. Shana Chute  Endometrial Ca  Pap of vaginal cuff today  Stage IA left breast Ca  Serial  monitering  Basal/ squamous skin CA  Followed by Dr. Altamese Cabal  Osteopenia  Calcium and vitamin D       Assessment & Plan:

## 2015-01-24 LAB — VARICELLA ZOSTER ANTIBODY, IGG: VARICELLA IGG: 629.7 {index} — AB (ref <135.00)

## 2015-01-24 LAB — CYTOLOGY - PAP

## 2015-01-24 LAB — VITAMIN D 25 HYDROXY (VIT D DEFICIENCY, FRACTURES): Vit D, 25-Hydroxy: 60 ng/mL (ref 30–100)

## 2015-01-24 NOTE — Progress Notes (Signed)
Mailed patient a copy of labs -eh

## 2015-01-25 ENCOUNTER — Telehealth: Payer: Self-pay | Admitting: *Deleted

## 2015-01-25 NOTE — Telephone Encounter (Signed)
-----   Message from Lanice Shirts, MD sent at 01/25/2015  2:41 PM EST ----- Call pt and let her know that it is OK to get the Herpes Zoster vaccine

## 2015-01-25 NOTE — Telephone Encounter (Signed)
Patient is aware that she can get the shingles vaccine

## 2015-01-26 ENCOUNTER — Ambulatory Visit (INDEPENDENT_AMBULATORY_CARE_PROVIDER_SITE_OTHER): Payer: Medicare Other | Admitting: *Deleted

## 2015-01-26 DIAGNOSIS — Z23 Encounter for immunization: Secondary | ICD-10-CM

## 2015-03-26 ENCOUNTER — Encounter: Payer: Self-pay | Admitting: *Deleted

## 2015-05-21 ENCOUNTER — Other Ambulatory Visit: Payer: Self-pay

## 2016-06-04 ENCOUNTER — Other Ambulatory Visit: Payer: Self-pay | Admitting: Internal Medicine

## 2016-06-04 ENCOUNTER — Other Ambulatory Visit (HOSPITAL_COMMUNITY)
Admission: RE | Admit: 2016-06-04 | Discharge: 2016-06-04 | Disposition: A | Discharge status: Home / Self Care | Payer: Medicare Other | Source: Ambulatory Visit | Attending: Internal Medicine | Admitting: Internal Medicine

## 2016-06-04 DIAGNOSIS — Z01411 Encounter for gynecological examination (general) (routine) with abnormal findings: Secondary | ICD-10-CM | POA: Diagnosis present

## 2016-06-09 LAB — CYTOLOGY - PAP

## 2016-06-18 ENCOUNTER — Other Ambulatory Visit: Payer: Self-pay | Admitting: Obstetrics and Gynecology

## 2017-05-05 ENCOUNTER — Ambulatory Visit (INDEPENDENT_AMBULATORY_CARE_PROVIDER_SITE_OTHER): Payer: Medicare Other | Admitting: Cardiology

## 2017-05-05 ENCOUNTER — Encounter: Payer: Self-pay | Admitting: Cardiology

## 2017-05-05 VITALS — BP 124/76 | HR 85 | Ht 68.0 in | Wt 153.2 lb

## 2017-05-05 DIAGNOSIS — Z8679 Personal history of other diseases of the circulatory system: Secondary | ICD-10-CM | POA: Diagnosis not present

## 2017-05-05 DIAGNOSIS — R002 Palpitations: Secondary | ICD-10-CM

## 2017-05-05 NOTE — Progress Notes (Signed)
05/05/2017 [name]   1945/12/11  [id number]  Primary Physician Coralyn Mark, Altamese Cabal, MD Primary Cardiologist: Dr. Curt Bears (New)   Reason for Visit/CC: New patient evaluation for palpitations  HPI:  Gloria Jackson is a 71 y.o. female who is being seen today, as a new patient, for the evaluation of palpitations at the request of [facility], Bethanne Ginger primary care.  She has no cardiac risk factors. There is no prior history of CAD, hypertension, diabetes or tobacco use. No h/o stroke/TIA.  She has a history of breast cancer that was treated with Herceptin however she reports that this was followed by serial echcardiograms and she did not develop any cardiotoxicity/ cardiomyopathy. Herceptin was discontinued 9 years ago. She reports in the past, she was told that she had MVP however this has not been followed in recent years. Her last echocardiogram was in March 2010.  There was no documentation of mitral valve prolapse, however it was mentioned that there was calcification of the mitral valve with moderate sub-mitral chondral involvement and mild mitral annular calcification plus mild mitral valve regurgitation. Ejection fraction at that time was normal at 50-55%.    She was recently seen by her PCP  given recent developments of palpitations. Symptoms first started 1 month ago. She has felt an irregularity in her heart rhythm. Feels like a flutter in her chest. She denies any associated chest pain, dyspnea, dizziness, lightheadedness, syncope/near-syncope. No recent fever, chills, nausea, vomiting, diarrhea. She admits that she has been under a great stress recently. Her husband has Parkinson's disease and she is his primary caregiver. She also admits to moderate caffeine consumption. She was drinking, on average, 2 cups of coffee a day in addition to [facility]. She felt that perhaps the caffeine was contributing and she reduced her consumption in half. Since then, she has noticed  considerable improvement in symptoms. Prior to making the adjustment, she was experiencing these palpitations daily. However since cutting back, she has not had any recurrent palpitations in the last 4 days. She is currently asymptomatic. EKG shows normal sinus rhythm with heart rate of 95 bpm and is also suggestive of right atrial enlargement. Blood pressure is well-controlled without any medication at 120/76.  Current Meds  Medication Sig  . aspirin 81 MG tablet Take 81 mg by mouth daily.  . B Complex Vitamins (B COMPLEX 1 PO) Take 1 tablet by mouth daily.   . Calcium-Vitamin D-Vitamin K 500-500-40 MG-UNT-MCG CHEW Chew 1 capsule by mouth 2 (two) times daily.  . cholecalciferol (VITAMIN D) 1000 UNITS tablet Take 1,000 Units by mouth daily.  . Coenzyme Q10 (CO Q 10 PO) Take 1 capsule by mouth every other day.  . Fiber TABS Take 4 tablets by mouth daily.  . fish oil-omega-3 fatty acids 1000 MG capsule Take 1 g by mouth daily.   Marland Kitchen latanoprost (XALATAN) 0.005 % ophthalmic solution Place 1 drop into both eyes daily.   . Multiple Vitamin (MULTI-VITAMIN PO) Take 1 tablet by mouth daily.   . Plant Sterols and Stanols (CHOLEST OFF PO) Take 1 tablet by mouth daily.   . Turmeric 500 MG CAPS Take 500 mg by mouth daily.   No Known Allergies Past Medical History:  Diagnosis Date  . Basal cell carcinoma of nose    Nose, arm, back  . Breast cancer ([place]) 01/2007   Left breast  . Colon polyps   . Endometrial cancer ([place])   . Glaucoma   . Osteopenia   .  Squamous cell cancer of skin of nose    Nose  . Vaginal polyp    Family History  Problem Relation Age of Onset  . Hypertension Mother   . Osteoporosis Mother   . Glaucoma Mother   . Heart attack Father   . Osteoporosis Sister   . Melanoma Sister    Past Surgical History:  Procedure Laterality Date  . ABDOMINAL HYSTERECTOMY     TAH BSO  . BASAL CELL CARCINOMA EXCISION  09/03/2010   Nose  . BREAST LUMPECTOMY W/ NEEDLE LOCALIZATION Left  03/12/2007  . MASTECTOMY Left 03/24/2008  . PORT-A-CATH REMOVAL    . TONSILLECTOMY     Social History   Social History  . Marital status: Married    Spouse name: N/A  . Number of children: N/A  . Years of education: N/A   Occupational History  . Not on file.   Social History Main Topics  . Smoking status: Never Smoker  . Smokeless tobacco: Never Used  . Alcohol use Not on file     Comment: Occasionally  . Drug use: No  . Sexual activity: Yes    Birth control/ protection: Surgical, Post-menopausal     Comment: MARRIED   Other Topics Concern  . Not on file   Social History Narrative  . No narrative on file     Review of Systems: General: negative for chills, fever, night sweats or weight changes.  Cardiovascular: negative for chest pain, dyspnea on exertion, edema, orthopnea, palpitations, paroxysmal nocturnal dyspnea or shortness of breath Dermatological: negative for rash Respiratory: negative for cough or wheezing Urologic: negative for hematuria Abdominal: negative for nausea, vomiting, diarrhea, bright red blood per rectum, melena, or hematemesis Neurologic: negative for visual changes, syncope, or dizziness All other systems reviewed and are otherwise negative except as noted above.   Physical Exam:  Blood pressure 124/76, pulse 85, height 5\' 8"  (1.727 m), weight 153 lb 3.2 oz (69.5 kg).  General appearance: alert, cooperative and no distress Neck: no adenopathy and no carotid bruit Lungs: clear to auscultation bilaterally Heart: regular rate and rhythm, S1, S2 normal, no murmur, click, rub or gallop Extremities: extremities normal, atraumatic, no cyanosis or edema Pulses: 2+ and symmetric Skin: Skin color, texture, turgor normal. No rashes or lesions Neurologic: Grossly normal  EKG not performed -- personally reviewed   ASSESSMENT AND PLAN:   1. Palpitations: pt notes considerable improvement since reducing her caffeine consumption in half. Patient was  drinking a moderate amount of caffeine daily while experiencing these palpitations. Since making dietary adjustments, she has not experienced any recurrent symptoms in the last 4 days. She is currently asymptomatic. EKG shows normal sinus rhythm. Heart rate is well controlled in the 80s. Her physical exam is benign. We discussed further evaluation with outpatient cardiac monitoring, however since she has noticed significant improvement with reduction in caffeine, we feel that it is also reasonable to continue avoidance of triggers for now and to consider outpatient monitor only if she develops any recurrent symptoms. This was discussed with Dr. Curt Bears, DOD, and he feels this approach is reasonable. After presenting the patient with both options, she also agrees that she would rather continue to avoid caffeine and not undergo further evaluation with outpatient monitoring this time. She will notify our office if she has any recurrent symptoms for placement of outpatient monitor.  2. Mitral valve dysfunction: She reports in the past, she was told that she had MVP however this has not been followed in  recent years. Her last echocardiogram was in March 2010.  There was no documentation of mitral valve prolapse, however it was mentioned that there was calcification of the mitral valve with moderate sub-mitral chondral involvement and mild mitral annular calcification plus mild mitral valve regurgitation. Ejection fraction at that time was normal at 50-55%. Given her recent palpitations, we will repeat an echocardiogram to reassess status of her mitral valve and to ensure no other structural heart disease.    Follow-Up: If echocardiogram is unrevealing, patient may follow-up as needed.   Brittainy Ladoris Gene, MHS [facility] 05/05/2017 11:24 AM

## 2017-05-05 NOTE — Patient Instructions (Signed)
Medication Instructions:  Your physician recommends that you continue on your current medications as directed. Please refer to the Current Medication list given to you today.   Labwork: None Ordered   Testing/Procedures: Your physician has requested that you have an echocardiogram. Echocardiography is a painless test that uses sound waves to create images of your heart. It provides your doctor with information about the size and shape of your heart and how well your heart's chambers and valves are working. This procedure takes approximately one hour. There are no restrictions for this procedure.    Follow-Up: Your physician recommends that you schedule a follow-up appointment in: as needed with Dr. Curt Bears  Any Other Special Instructions Will Be Listed Below (If Applicable).  Lower the intake of caffeine    If you need a refill on your cardiac medications before your next appointment, please call your pharmacy.

## 2017-05-11 ENCOUNTER — Telehealth: Payer: Self-pay | Admitting: Cardiology

## 2017-05-11 DIAGNOSIS — R002 Palpitations: Secondary | ICD-10-CM

## 2017-05-11 NOTE — Telephone Encounter (Signed)
Spoke with pt and she states that her palpitations have returned.  They last hours at a time.  Has been having them everyday since Friday.  Denies SOB, lightheadedness or dizziness.  Denies increase in caffeine intake.  Pt states Brttainy had mentioned possibly placing a monitor if symptoms returned.   Advised I will send message to Nicky Pugh, PA-C for review and advisement on whether she would like monitor now?

## 2017-05-11 NOTE — Telephone Encounter (Signed)
New message   Pt verbalized that she was told that she can get a Holter monitor

## 2017-05-13 NOTE — Telephone Encounter (Signed)
Returned pts call and we have agreed on the 30 day event monitor. This has been ordered.

## 2017-05-13 NOTE — Telephone Encounter (Signed)
We can set up with outpatient monitor. If symptoms are happening daily then 48 hr monitor will be appropriate. If more sporadic, then 30 day would be best. F/u ~1 week after monitor.

## 2017-05-13 NOTE — Telephone Encounter (Signed)
Follow Up:   Pt calling back,wants to know what was decided.I contacted Areatha Keas was instructed to send encounter to you please.

## 2017-05-19 ENCOUNTER — Ambulatory Visit (INDEPENDENT_AMBULATORY_CARE_PROVIDER_SITE_OTHER): Payer: Medicare Other

## 2017-05-19 ENCOUNTER — Ambulatory Visit (HOSPITAL_COMMUNITY): Payer: Medicare Other | Attending: Cardiology

## 2017-05-19 ENCOUNTER — Other Ambulatory Visit: Payer: Self-pay

## 2017-05-19 DIAGNOSIS — R002 Palpitations: Secondary | ICD-10-CM | POA: Diagnosis not present

## 2017-05-19 DIAGNOSIS — Z853 Personal history of malignant neoplasm of breast: Secondary | ICD-10-CM | POA: Diagnosis not present

## 2017-05-19 DIAGNOSIS — Z8679 Personal history of other diseases of the circulatory system: Secondary | ICD-10-CM | POA: Insufficient documentation

## 2017-07-10 ENCOUNTER — Encounter: Payer: Self-pay | Admitting: Cardiology

## 2017-07-10 ENCOUNTER — Ambulatory Visit (INDEPENDENT_AMBULATORY_CARE_PROVIDER_SITE_OTHER): Payer: Medicare Other | Admitting: Cardiology

## 2017-07-10 VITALS — BP 122/78 | HR 82 | Ht 68.0 in | Wt 152.0 lb

## 2017-07-10 DIAGNOSIS — Z87898 Personal history of other specified conditions: Secondary | ICD-10-CM

## 2017-07-10 LAB — CBC
Hematocrit: 38.1 % (ref 34.0–46.6)
Hemoglobin: 12.9 g/dL (ref 11.1–15.9)
MCH: 30.8 pg (ref 26.6–33.0)
MCHC: 33.9 g/dL (ref 31.5–35.7)
MCV: 91 fL (ref 79–97)
PLATELETS: 267 10*3/uL (ref 150–379)
RBC: 4.19 x10E6/uL (ref 3.77–5.28)
RDW: 13.8 % (ref 12.3–15.4)
WBC: 4.7 10*3/uL (ref 3.4–10.8)

## 2017-07-10 LAB — BASIC METABOLIC PANEL
BUN / CREAT RATIO: 14 (ref 12–28)
BUN: 10 mg/dL (ref 8–27)
CO2: 25 mmol/L (ref 20–29)
CREATININE: 0.71 mg/dL (ref 0.57–1.00)
Calcium: 9.8 mg/dL (ref 8.7–10.3)
Chloride: 99 mmol/L (ref 96–106)
GFR, EST AFRICAN AMERICAN: 98 mL/min/{1.73_m2} (ref >59)
GFR, EST NON AFRICAN AMERICAN: 85 mL/min/{1.73_m2} (ref >59)
Glucose: 95 mg/dL (ref 65–99)
Potassium: 4.4 mmol/L (ref 3.5–5.2)
Sodium: 139 mmol/L (ref 134–144)

## 2017-07-10 LAB — MAGNESIUM: Magnesium: 1.9 mg/dL (ref 1.6–2.3)

## 2017-07-10 LAB — T3, FREE: T3, Free: 2.6 pg/mL (ref 2.0–4.4)

## 2017-07-10 LAB — TSH: TSH: 4.53 u[IU]/mL — AB (ref 0.450–4.500)

## 2017-07-10 LAB — T4, FREE: Free T4: 0.94 ng/dL (ref 0.82–1.77)

## 2017-07-10 NOTE — Patient Instructions (Addendum)
Medication Instructions:   Your physician recommends that you continue on your current medications as directed. Please refer to the Current Medication list given to you today.  If you need a refill on your cardiac medications before your next appointment, please call your pharmacy.  Labwork: CBC BMET TSH  MAG AND FREE T3 AND T4    Testing/Procedures: NONE ORDERED  TODAY    Follow-Up: CONTACT [facility] [phone] AS NEEDED FOR  ANY CARDIAC RELATED SYMPTOMS     Any Other Special Instructions Will Be Listed Below (If Applicable).

## 2017-07-10 NOTE — Progress Notes (Signed)
07/10/2017 [name]   August 24, 1946  [id number]  Primary Physician Coralyn Mark, Altamese Cabal, MD Primary Cardiologist: Dr. Curt Bears   Reason for Visit/CC: New patient evaluation for palpitations  HPI:  Gloria Jackson is a 71 y.o. female , who returns to clinic today for f/u for palpiations.   She was referred to our office by her PCP recently given development of palpitations.   To summarize the patient's history, she has no cardiac risk factors. There is no prior history of CAD, hypertension, diabetes or tobacco use. No h/o stroke/TIA.  She has a history of breast cancer that was treated with Herceptin however she reports that this was followed by serial echcardiograms and she did not develop any cardiotoxicity/ cardiomyopathy. Herceptin was discontinued 9 years ago. She reports in the past, she was told that she had MVP. She had an echo in 2010 that mentioned that there was calcification of the mitral valve with moderate sub-mitral chondral involvement and mild mitral annular calcification plus mild mitral valve regurgitation. Ejection fraction at that time was normal at 50-55%.   She was recently seen by her PCP  given recent developments of palpitations. Symptoms first started 2 months ago. She has felt an irregularity in her heart rhythm. Feels like a flutter in her chest. She denies any associated chest pain, dyspnea, dizziness, lightheadedness, syncope/near-syncope. No recent fever, chills, nausea, vomiting, diarrhea. She admits that she has been under a great stress recently. Her husband has Parkinson's disease and she is his primary caregiver. She also admited to moderate caffeine consumption.  We elected to have her wear an ambulatory heart monitor. This was reviewed by Dr. Curt Bears. She was found to have SR with occasional PVCs. Her manual triggers for palpitations and rapid heart rate were found to be associated with SR.  We also got a f/u 2D echo to check her MV. 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 LVEF was normal at 55-60%. Wall motion was normal. Grade 1DD was noted. Aortic valve was normal.    Today in clinic, she reports that she has done ok, however she continues to have palpitations regularly, despite reduction in caffieene. She is currently asymptomatic in clinic. RRR on exam. BP is well controlled at 122/78. HR is 82 bpm. She reports she has not had blood work done since last November.   Current Meds  Medication Sig  . aspirin 81 MG tablet Take 81 mg by mouth daily.  . B Complex Vitamins (B COMPLEX 1 PO) Take 1 tablet by mouth daily.   . Calcium-Vitamin D-Vitamin K 500-500-40 MG-UNT-MCG CHEW Chew 1 capsule by mouth 2 (two) times daily.  . cholecalciferol (VITAMIN D) 1000 UNITS tablet Take 1,000 Units by mouth daily.  . Coenzyme Q10 (CO Q 10 PO) Take 1 capsule by mouth every other day.  . Fiber TABS Take 4 tablets by mouth daily.  . fish oil-omega-3 fatty acids 1000 MG capsule Take 1 g by mouth daily.   Marland Kitchen latanoprost (XALATAN) 0.005 % ophthalmic solution Place 1 drop into both eyes daily.   . Multiple Vitamin (MULTI-VITAMIN PO) Take 1 tablet by mouth daily.   . Plant Sterols and Stanols (CHOLEST OFF PO) Take 1 tablet by mouth daily.   . Turmeric 500 MG CAPS Take 500 mg by mouth daily.   No Known Allergies Past Medical History:  Diagnosis Date  . Basal cell carcinoma of nose    Nose, arm, back  . Breast cancer ([place]) 01/2007   Left  breast  . Colon polyps   . Endometrial cancer ([place])   . Glaucoma   . Osteopenia   . Squamous cell cancer of skin of nose    Nose  . Vaginal polyp    Family History  Problem Relation Age of Onset  . Hypertension Mother   . Osteoporosis Mother   . Glaucoma Mother   . Heart attack Father   . Osteoporosis Sister   . Melanoma Sister    Past Surgical History:  Procedure Laterality Date  . ABDOMINAL HYSTERECTOMY     TAH BSO  . BASAL CELL CARCINOMA EXCISION  09/03/2010   Nose  . BREAST LUMPECTOMY W/ NEEDLE  LOCALIZATION Left 03/12/2007  . MASTECTOMY Left 03/24/2008  . PORT-A-CATH REMOVAL    . TONSILLECTOMY     Social History   Social History  . Marital status: Married    Spouse name: N/A  . Number of children: N/A  . Years of education: N/A   Occupational History  . Not on file.   Social History Main Topics  . Smoking status: Never Smoker  . Smokeless tobacco: Never Used  . Alcohol use Not on file     Comment: Occasionally  . Drug use: No  . Sexual activity: Yes    Birth control/ protection: Surgical, Post-menopausal     Comment: MARRIED   Other Topics Concern  . Not on file   Social History Narrative  . No narrative on file     Review of Systems: General: negative for chills, fever, night sweats or weight changes.  Cardiovascular: negative for chest pain, dyspnea on exertion, edema, orthopnea, palpitations, paroxysmal nocturnal dyspnea or shortness of breath Dermatological: negative for rash Respiratory: negative for cough or wheezing Urologic: negative for hematuria Abdominal: negative for nausea, vomiting, diarrhea, bright red blood per rectum, melena, or hematemesis Neurologic: negative for visual changes, syncope, or dizziness All other systems reviewed and are otherwise negative except as noted above.   Physical Exam:  Blood pressure 122/78, pulse 82, height 5\' 8"  (1.727 m), weight 152 lb (68.9 kg), SpO2 98 %.  General appearance: alert, cooperative and no distress Neck: no carotid bruit, no JVD and thyroid not enlarged, symmetric, no tenderness/mass/nodules Lungs: clear to auscultation bilaterally Heart: regular rate and rhythm, S1, S2 normal, no murmur, click, rub or gallop Extremities: extremities normal, atraumatic, no cyanosis or edema Pulses: 2+ and symmetric Skin: Skin color, texture, turgor normal. No rashes or lesions Neurologic: Grossly normal  EKG not performed -- personally reviewed   ASSESSMENT AND PLAN:   1. Palpitations: recent outpatient  monitor showed SR with occasional PVCs. 2D echo with normal LVEF and no significant valvular disease. Pt notes she continues to have palpitations daily, despite reduction in caffeine. She denies ETOH. She has not had labs done since Nov 2017. We will check lytes today with BMP and Mg level as well as CBC and thyroid function test. Pt will also f/u with her PCP in Nov. I reassured patient that her monitor findings were benign, but If her symptoms increase we can consider low dose BB.    Follow-Up PRN  Nelida Gores, MHS [facility] 07/10/2017 10:21 AM

## 2018-12-30 ENCOUNTER — Other Ambulatory Visit: Payer: Self-pay | Admitting: Internal Medicine

## 2018-12-30 ENCOUNTER — Other Ambulatory Visit (HOSPITAL_COMMUNITY)
Admission: RE | Admit: 2018-12-30 | Discharge: 2018-12-30 | Disposition: A | Discharge status: Home / Self Care | Payer: Medicare Other | Source: Ambulatory Visit | Attending: Internal Medicine | Admitting: Internal Medicine

## 2018-12-30 DIAGNOSIS — Z01411 Encounter for gynecological examination (general) (routine) with abnormal findings: Secondary | ICD-10-CM | POA: Diagnosis present

## 2019-01-03 LAB — CYTOLOGY - PAP
Diagnosis: NEGATIVE
HPV: NOT DETECTED

## 2019-12-16 ENCOUNTER — Ambulatory Visit: Payer: Medicare Other | Attending: Internal Medicine

## 2019-12-16 DIAGNOSIS — Z23 Encounter for immunization: Secondary | ICD-10-CM | POA: Insufficient documentation

## 2019-12-16 NOTE — Progress Notes (Signed)
   Covid-19 Vaccination Clinic  Name:  Gloria Jackson    MRN: [id number] DOB: 11/09/1946  12/16/2019  [name] was observed post Covid-19 immunization for 15 minutes without incidence. She was provided with Vaccine Information Sheet and instruction to access the V-Safe system.   [name] was instructed to call 911 with any severe reactions post vaccine: Marland Kitchen Difficulty breathing  . Swelling of your face and throat  . A fast heartbeat  . A bad rash all over your body  . Dizziness and weakness    Immunizations Administered    Name Date Dose VIS Date Route   Pfizer COVID-19 Vaccine 12/16/2019  9:59 AM 0.3 mL 11/04/2019 Intramuscular   Manufacturer: [place]   Lot: [id number]   [place]: [id number]

## 2020-01-06 ENCOUNTER — Ambulatory Visit: Payer: Medicare Other | Attending: Internal Medicine

## 2020-01-06 DIAGNOSIS — Z23 Encounter for immunization: Secondary | ICD-10-CM

## 2020-01-06 NOTE — Progress Notes (Signed)
   Covid-19 Vaccination Clinic  Name:  RUCHEL COMP    MRN: [id number] DOB: 04/23/1946  01/06/2020  Ms. Lagace was observed post Covid-19 immunization for 15 minutes without incidence. She was provided with Vaccine Information Sheet and instruction to access the V-Safe system.   Ms. Dunstan was instructed to call 911 with any severe reactions post vaccine: Marland Kitchen Difficulty breathing  . Swelling of your face and throat  . A fast heartbeat  . A bad rash all over your body  . Dizziness and weakness    Immunizations Administered    Name Date Dose VIS Date Route   Pfizer COVID-19 Vaccine 01/06/2020  8:51 AM 0.3 mL 11/04/2019 Intramuscular   Manufacturer: [place]   Lot: [id number]   [place]: [id number]
